# Patient Record
Sex: Female | Born: 1965 | Race: White | Hispanic: No | Marital: Married | Smoking: Never smoker
Health system: Southern US, Community
[De-identification: ages and names within clinical notes are randomized; demographics above are authoritative.]

## PROBLEM LIST (undated history)

## (undated) DIAGNOSIS — F32A Depression, unspecified: Secondary | ICD-10-CM

## (undated) DIAGNOSIS — R42 Dizziness and giddiness: Secondary | ICD-10-CM

## (undated) DIAGNOSIS — G43909 Migraine, unspecified, not intractable, without status migrainosus: Secondary | ICD-10-CM

## (undated) DIAGNOSIS — T7840XA Allergy, unspecified, initial encounter: Secondary | ICD-10-CM

## (undated) DIAGNOSIS — I454 Nonspecific intraventricular block: Secondary | ICD-10-CM

## (undated) DIAGNOSIS — F410 Panic disorder [episodic paroxysmal anxiety] without agoraphobia: Secondary | ICD-10-CM

## (undated) DIAGNOSIS — F329 Major depressive disorder, single episode, unspecified: Secondary | ICD-10-CM

## (undated) HISTORY — DX: Allergy, unspecified, initial encounter: T78.40XA

## (undated) HISTORY — DX: Migraine, unspecified, not intractable, without status migrainosus: G43.909

## (undated) HISTORY — PX: WISDOM TOOTH EXTRACTION: SHX21

---

## 1999-05-04 ENCOUNTER — Other Ambulatory Visit: Admission: RE | Admit: 1999-05-04 | Discharge: 1999-05-04 | Payer: Self-pay | Admitting: Gynecology

## 1999-10-23 ENCOUNTER — Encounter: Payer: Self-pay | Admitting: Emergency Medicine

## 1999-10-23 ENCOUNTER — Emergency Department (HOSPITAL_COMMUNITY): Admission: EM | Admit: 1999-10-23 | Discharge: 1999-10-23 | Payer: Self-pay | Admitting: Emergency Medicine

## 1999-12-02 ENCOUNTER — Inpatient Hospital Stay (HOSPITAL_COMMUNITY): Admission: AD | Admit: 1999-12-02 | Discharge: 1999-12-04 | Payer: Self-pay | Admitting: Gynecology

## 1999-12-02 ENCOUNTER — Encounter (INDEPENDENT_AMBULATORY_CARE_PROVIDER_SITE_OTHER): Payer: Self-pay | Admitting: Specialist

## 2000-01-22 ENCOUNTER — Other Ambulatory Visit: Admission: RE | Admit: 2000-01-22 | Discharge: 2000-01-22 | Payer: Self-pay | Admitting: Internal Medicine

## 2000-06-05 ENCOUNTER — Encounter: Payer: Self-pay | Admitting: Gynecology

## 2000-06-05 ENCOUNTER — Ambulatory Visit (HOSPITAL_COMMUNITY): Admission: RE | Admit: 2000-06-05 | Discharge: 2000-06-05 | Payer: Self-pay | Admitting: Gynecology

## 2002-10-06 ENCOUNTER — Other Ambulatory Visit: Admission: RE | Admit: 2002-10-06 | Discharge: 2002-10-06 | Payer: Self-pay | Admitting: Gynecology

## 2008-06-09 ENCOUNTER — Other Ambulatory Visit: Admission: RE | Admit: 2008-06-09 | Discharge: 2008-06-09 | Payer: Self-pay | Admitting: Gynecology

## 2009-07-08 ENCOUNTER — Other Ambulatory Visit: Admission: RE | Admit: 2009-07-08 | Discharge: 2009-07-08 | Payer: Self-pay | Admitting: Family Medicine

## 2011-04-06 NOTE — Discharge Summary (Signed)
[facility]  Patient:    Stephanie Chan                   MRN: [id number] Adm. Date:  [id number] Disc. Date: [id number] Attending:  Merrily Pew Dictator:   Gwendolyn Fill. [place], P.A.                           Discharge Summary  DISCHARGE DIAGNOSES:          1. Intrauterine pregnancy 38+ weeks, delivered,                                  status post spontaneous vaginal delivery                               2. History of prior cesarean section.                               3. Successful vaginal delivery without cesarean                                  section.  HISTORY OF PRESENT ILLNESS:   This is a 45 year old female, gravida 3, para 2, ith and EDC of December 15, 1999.  Prenatal course has been complicated by history of positive group B strep for previous pregnancy.  Also, had history of prior cesarean section, desired trial of labor, and was on Prozac 40 mg q.d. for depression/obsessive-compulsive disorder.  HOSPITAL COURSE:              On December 02, 1999, the patient presented at 38 weeks in labor and urge to push at complete _______, and +1.  There was bulging  membranes.  At [facility] of membranes was noted to be thin meconium, and subsequently the patient underwent a spontaneous vaginal delivery on December 02, 1999, at 0310 of a female, Apgars 8 and 9, weight of 7 pounds and 12 ounces. There was no episiotomy.  There was as second degree midline laceration.  There were o complications.  Lower uterine segment was digitally palpated and was intact. Postpartum the patient remained afebrile, voiding well, and in stable condition, and was discharged home on December 04, 1999, and given [facility] Gynecology postpartum instruction sheet and postpartum booklet.  ACCESSORY CLINICAL FINDINGS:  Labs:  The patient is O positive, rubella immune. On December 03, 1999, hemoglobin was 10.4, hematocrit 30.0.  DISPOSITION:                   The patient is discharged to home.  FOLLOWUP:                     To return in six weeks.  If any problems prior to  that time is to be seen in the office. DD:  12/21/99 TD:  12/23/99 Job: [id number]

## 2012-11-25 ENCOUNTER — Encounter (HOSPITAL_COMMUNITY): Payer: Self-pay

## 2012-11-25 ENCOUNTER — Emergency Department (HOSPITAL_COMMUNITY)
Admission: EM | Admit: 2012-11-25 | Discharge: 2012-11-25 | Disposition: A | Discharge status: Home / Self Care | Payer: No Typology Code available for payment source | Attending: Emergency Medicine | Admitting: Emergency Medicine

## 2012-11-25 DIAGNOSIS — R110 Nausea: Secondary | ICD-10-CM | POA: Insufficient documentation

## 2012-11-25 DIAGNOSIS — Z8679 Personal history of other diseases of the circulatory system: Secondary | ICD-10-CM | POA: Insufficient documentation

## 2012-11-25 DIAGNOSIS — Z79899 Other long term (current) drug therapy: Secondary | ICD-10-CM | POA: Insufficient documentation

## 2012-11-25 DIAGNOSIS — F329 Major depressive disorder, single episode, unspecified: Secondary | ICD-10-CM | POA: Insufficient documentation

## 2012-11-25 DIAGNOSIS — Z7982 Long term (current) use of aspirin: Secondary | ICD-10-CM | POA: Insufficient documentation

## 2012-11-25 DIAGNOSIS — R42 Dizziness and giddiness: Secondary | ICD-10-CM | POA: Insufficient documentation

## 2012-11-25 DIAGNOSIS — F410 Panic disorder [episodic paroxysmal anxiety] without agoraphobia: Secondary | ICD-10-CM | POA: Insufficient documentation

## 2012-11-25 DIAGNOSIS — F17200 Nicotine dependence, unspecified, uncomplicated: Secondary | ICD-10-CM | POA: Insufficient documentation

## 2012-11-25 DIAGNOSIS — F3289 Other specified depressive episodes: Secondary | ICD-10-CM | POA: Insufficient documentation

## 2012-11-25 HISTORY — DX: Nonspecific intraventricular block: I45.4

## 2012-11-25 HISTORY — DX: Panic disorder (episodic paroxysmal anxiety): F41.0

## 2012-11-25 HISTORY — DX: Depression, unspecified: F32.A

## 2012-11-25 HISTORY — DX: Dizziness and giddiness: R42

## 2012-11-25 HISTORY — DX: Major depressive disorder, single episode, unspecified: F32.9

## 2012-11-25 LAB — POCT I-STAT, CHEM 8
Calcium, Ion: 1.14 mmol/L (ref 1.12–1.23)
Creatinine, Ser: 0.7 mg/dL (ref 0.50–1.10)
Glucose, Bld: 86 mg/dL (ref 70–99)
HCT: 38 % (ref 36.0–46.0)
Hemoglobin: 12.9 g/dL (ref 12.0–15.0)
TCO2: 24 mmol/L (ref 0–100)

## 2012-11-25 LAB — URINALYSIS, ROUTINE W REFLEX MICROSCOPIC
Bilirubin Urine: NEGATIVE
Hgb urine dipstick: NEGATIVE
Ketones, ur: NEGATIVE mg/dL
Nitrite: NEGATIVE
Urobilinogen, UA: 0.2 mg/dL (ref 0.0–1.0)

## 2012-11-25 MED ORDER — LORAZEPAM 1 MG PO TABS: 1.0000 mg | ORAL_TABLET | Freq: Four times a day (QID) | ORAL | Status: DC | PRN

## 2012-11-25 MED ORDER — SODIUM CHLORIDE 0.9 % IV SOLN
INTRAVENOUS | Status: DC
  Administered 2012-11-25: 11:00:00 via INTRAVENOUS

## 2012-11-25 MED ORDER — ONDANSETRON HCL 4 MG/2ML IJ SOLN
4.0000 mg | Freq: Once | INTRAMUSCULAR | Status: AC
  Administered 2012-11-25: 4 mg via INTRAVENOUS
  Filled 2012-11-25: qty 2

## 2012-11-25 MED ORDER — LORAZEPAM 2 MG/ML IJ SOLN
1.0000 mg | Freq: Once | INTRAMUSCULAR | Status: AC
  Administered 2012-11-25: 1 mg via INTRAVENOUS
  Filled 2012-11-25: qty 1

## 2012-11-25 NOTE — ED Notes (Signed)
Patient reports that she developed dizziness and nausea this AM around 0830 today. Patient states she has been off of her Lexapro x 1 week. Patient reports vertigo history.

## 2012-11-25 NOTE — Progress Notes (Signed)
Pt confirms pcp as Mila Palmer at [facility] medicine EPIC updated

## 2012-11-25 NOTE — ED Provider Notes (Signed)
History     CSN: [id number]  Arrival date & time 11/25/12  1006   First MD Initiated Contact with Patient 11/25/12 1015      Chief Complaint  Patient presents with  . Dizziness    (Consider location/radiation/quality/duration/timing/severity/associated sxs/prior treatment) HPI Comments: Stephanie Chan is a 47 y.o. female who has had vertiginous dizziness that started when she awoke, her from sleep this morning. The dizziness is aggravated by opening her eyes and makes walking difficult. She attributes the dizziness to being off her Lexapro for one week. She accidentally ran out of the prescription. She has had this previously, but not chronically. No recent fever, or chills, or vomiting. She's been nauseated this morning. She has mild, nasal congestion, but no facial pain. She has been able to eat up until this morning. She denies recent stress, and there has been no head trauma. She is not on any chronic medications. There are no other modifying factors.  The history is provided by the patient.    Past Medical History  Diagnosis Date  . Vertigo   . Bundle branch block   . Depression   . Panic attack     Past Surgical History  Procedure Date  . Cesarean section     No family history on file.  History  Substance Use Topics  . Smoking status: Current Some Day Smoker  . Smokeless tobacco: Never Used  . Alcohol Use: Yes     Comment: socially    OB History    Grav Para Term Preterm Abortions TAB SAB Ect Mult Living                  Review of Systems  All other systems reviewed and are negative.    Allergies  Review of patient's allergies indicates no known allergies.  Home Medications   Current Outpatient Rx  Name  Route  Sig  Dispense  Refill  . ACETAMINOPHEN 500 MG PO TABS   Oral   Take 500 mg by mouth every 6 (six) hours as needed. Pain         . ALPRAZOLAM 0.25 MG PO TABS   Oral   Take 0.25 mg by mouth as needed. anxiety         . ASPIRIN EC  81 MG PO TBEC   Oral   Take 81 mg by mouth daily.         . BUPROPION HCL ER (XL) 300 MG PO TB24   Oral   Take 300 mg by mouth daily.         Marland Kitchen ESCITALOPRAM OXALATE 10 MG PO TABS   Oral   Take 10 mg by mouth daily.         . IBUPROFEN 200 MG PO TABS   Oral   Take 400 mg by mouth every 6 (six) hours as needed. Pain         . ADULT MULTIVITAMIN W/MINERALS CH   Oral   Take 1 tablet by mouth daily.         Marland Kitchen LORAZEPAM 1 MG PO TABS   Oral   Take 1 tablet (1 mg total) by mouth every 6 (six) hours as needed (dizziness).   15 tablet   0     BP 103/57  Pulse 70  Temp 98.6 F (37 C)  Resp 19  SpO2 100%  LMP 10/25/2012  Physical Exam  Nursing note and vitals reviewed. Constitutional: She is oriented to person, place, and  time. She appears well-developed and well-nourished.  HENT:  Head: Normocephalic and atraumatic.  Eyes: Conjunctivae normal and EOM are normal. Pupils are equal, round, and reactive to light.  Neck: Normal range of motion and phonation normal. Neck supple.  Cardiovascular: Normal rate, regular rhythm and intact distal pulses.   Pulmonary/Chest: Effort normal and breath sounds normal. She exhibits no tenderness.  Abdominal: Soft. She exhibits no distension. There is no tenderness. There is no guarding.  Musculoskeletal: Normal range of motion.  Neurological: She is alert and oriented to person, place, and time. She has normal strength. No cranial nerve deficit. She exhibits normal muscle tone. Coordination normal.       No nystagmus  Skin: Skin is warm and dry.  Psychiatric: She has a normal mood and affect. Her behavior is normal. Judgment and thought content normal.    ED Course  Procedures (including critical care time)   ED Treatment: IVF, Ativan, Zofran    Date: 09/05/2012  Rate: 76  Rhythm: normal sinus rhythm  QRS Axis: normal  PR and QT Intervals: normal  ST/T Wave abnormalities: normal  PR and QRS Conduction Disutrbances:left  bundle branch block  Narrative Interpretation: Pt recalls prior hx of LBBB  Old EKG Reviewed: none available   Reeval.: 14:35- she is ambulating easily without ataxia or dizziness. She's just voided again, for urine sample.   Labs Reviewed  POCT I-STAT, CHEM 8  URINALYSIS, ROUTINE W REFLEX MICROSCOPIC   Nursing notes, applicable records and vitals reviewed.  Radiologic Images/Reports reviewed.    1. Dizziness       MDM  Nonspecific dizziness, possible labyrinthitis. Doubt CVA, ACS, metabolic instability, or of infection. She is stable for discharge.      Plan: Home Medications- Ativan; Home Treatments- rest, fluids; Recommended follow up- PCP, 3-5 days    Flint Melter, MD 11/25/12 [phone]

## 2012-11-25 NOTE — ED Notes (Signed)
MD at bedside. 

## 2012-11-25 NOTE — ED Notes (Signed)
Patient ambulated 40 feet with little assistance. Patient 's heart rate stayed at 85 bets per minute.

## 2012-11-25 NOTE — ED Notes (Signed)
Pt sts dizziness and nausea starting this AM.  Denies pain.  Sts "I think, I did this to myself.  I think, it's withdrawal from my Lexapro."  Reports Hx of vertigo.

## 2012-12-26 ENCOUNTER — Other Ambulatory Visit (HOSPITAL_COMMUNITY)
Admission: RE | Admit: 2012-12-26 | Discharge: 2012-12-26 | Disposition: A | Discharge status: Home / Self Care | Payer: No Typology Code available for payment source | Source: Ambulatory Visit | Attending: Family Medicine | Admitting: Family Medicine

## 2012-12-26 ENCOUNTER — Other Ambulatory Visit: Payer: Self-pay | Admitting: Family Medicine

## 2012-12-26 DIAGNOSIS — Z124 Encounter for screening for malignant neoplasm of cervix: Secondary | ICD-10-CM | POA: Insufficient documentation

## 2012-12-26 DIAGNOSIS — Z1151 Encounter for screening for human papillomavirus (HPV): Secondary | ICD-10-CM | POA: Insufficient documentation

## 2013-04-02 ENCOUNTER — Encounter (HOSPITAL_COMMUNITY): Payer: Self-pay | Admitting: *Deleted

## 2013-04-02 ENCOUNTER — Emergency Department (HOSPITAL_COMMUNITY)
Admission: EM | Admit: 2013-04-02 | Discharge: 2013-04-02 | Disposition: A | Discharge status: Home / Self Care | Payer: No Typology Code available for payment source | Attending: Emergency Medicine | Admitting: Emergency Medicine

## 2013-04-02 DIAGNOSIS — Z79899 Other long term (current) drug therapy: Secondary | ICD-10-CM | POA: Insufficient documentation

## 2013-04-02 DIAGNOSIS — W268XXA Contact with other sharp object(s), not elsewhere classified, initial encounter: Secondary | ICD-10-CM | POA: Insufficient documentation

## 2013-04-02 DIAGNOSIS — Z87898 Personal history of other specified conditions: Secondary | ICD-10-CM

## 2013-04-02 DIAGNOSIS — Z8679 Personal history of other diseases of the circulatory system: Secondary | ICD-10-CM | POA: Insufficient documentation

## 2013-04-02 DIAGNOSIS — Y9289 Other specified places as the place of occurrence of the external cause: Secondary | ICD-10-CM | POA: Insufficient documentation

## 2013-04-02 DIAGNOSIS — S61509A Unspecified open wound of unspecified wrist, initial encounter: Secondary | ICD-10-CM | POA: Insufficient documentation

## 2013-04-02 DIAGNOSIS — IMO0002 Reserved for concepts with insufficient information to code with codable children: Secondary | ICD-10-CM

## 2013-04-02 DIAGNOSIS — Z8659 Personal history of other mental and behavioral disorders: Secondary | ICD-10-CM

## 2013-04-02 DIAGNOSIS — Z87891 Personal history of nicotine dependence: Secondary | ICD-10-CM | POA: Insufficient documentation

## 2013-04-02 DIAGNOSIS — F3289 Other specified depressive episodes: Secondary | ICD-10-CM | POA: Insufficient documentation

## 2013-04-02 DIAGNOSIS — Y93E9 Activity, other interior property and clothing maintenance: Secondary | ICD-10-CM | POA: Insufficient documentation

## 2013-04-02 DIAGNOSIS — F329 Major depressive disorder, single episode, unspecified: Secondary | ICD-10-CM | POA: Insufficient documentation

## 2013-04-02 DIAGNOSIS — Z8669 Personal history of other diseases of the nervous system and sense organs: Secondary | ICD-10-CM | POA: Insufficient documentation

## 2013-04-02 DIAGNOSIS — F410 Panic disorder [episodic paroxysmal anxiety] without agoraphobia: Secondary | ICD-10-CM | POA: Insufficient documentation

## 2013-04-02 DIAGNOSIS — Z7982 Long term (current) use of aspirin: Secondary | ICD-10-CM | POA: Insufficient documentation

## 2013-04-02 NOTE — ED Provider Notes (Signed)
Medical screening examination/treatment/procedure(s) were conducted as a shared visit with non-physician practitioner(s) and myself.  I personally evaluated the patient during the encounter.  This 47 year old female has a small linear laceration left volar wrist accident trauma wound explored through range of motion with good hemostasis no foreign body noted no significant deep structure involvement noted such as any tendon involvement.  Hurman Horn, MD 04/13/13 [phone]

## 2013-04-02 NOTE — ED Provider Notes (Signed)
History     CSN: [id number]  Arrival date & time 04/02/13  1308   First MD Initiated Contact with Patient 04/02/13 0820      Chief Complaint  Patient presents with  . Extremity Laceration    (Consider location/radiation/quality/duration/timing/severity/associated sxs/prior treatment) HPI Comments: Stephanie Chan is a 47 y/o F with PMhx of vertigo, BBB, depression, and panic attacks presenting to the ED after sustaining an injury to th left wrist. Patient reported that as she was cleaning the vase in the sink this morning the glass vase shattered, leading to a horizontal laceration to the flexor surface of the left wrist. Patient reported that there is a burning sensation to the site with mild discomfort with motion, mainly with extension to the left wrist. Patient reported that she came immediately to the hospital, applying compression to the site. Denied numbness, tingling, fainting, light-headedness, dizziness, syncope, chest pain, shortness of breathe, difficulty breathing, visual distortions.  PCP: Dr. Ali Lowe  Tdap administered recently  The history is provided by the patient. No language interpreter was used.    Past Medical History  Diagnosis Date  . Vertigo   . Bundle branch block   . Depression   . Panic attack     Past Surgical History  Procedure Laterality Date  . Cesarean section      No family history on file.  History  Substance Use Topics  . Smoking status: Former Games developer  . Smokeless tobacco: Never Used  . Alcohol Use: Yes     Comment: socially    OB History   Grav Para Term Preterm Abortions TAB SAB Ect Mult Living                  Review of Systems  Constitutional: Negative for fever, chills and fatigue.  HENT: Negative for ear pain, sore throat, trouble swallowing, neck pain and neck stiffness.   Eyes: Negative for photophobia, pain and visual disturbance.  Respiratory: Negative for cough, chest tightness and shortness of breath.     Cardiovascular: Negative for chest pain.  Gastrointestinal: Negative for nausea, vomiting, abdominal pain and diarrhea.  Genitourinary: Negative for dysuria, hematuria and difficulty urinating.  Musculoskeletal: Negative for back pain and arthralgias.  Skin: Positive for wound (laceration to left wrist).  Neurological: Negative for dizziness, weakness, light-headedness, numbness and headaches.  All other systems reviewed and are negative.    Allergies  Review of patient's allergies indicates no known allergies.  Home Medications   Current Outpatient Rx  Name  Route  Sig  Dispense  Refill  . acetaminophen (TYLENOL) 500 MG tablet   Oral   Take 500 mg by mouth every 6 (six) hours as needed. Pain         . ALPRAZolam (XANAX) 0.25 MG tablet   Oral   Take 0.25 mg by mouth daily as needed. anxiety         . aspirin EC 81 MG tablet   Oral   Take 81 mg by mouth daily.         Marland Kitchen buPROPion (WELLBUTRIN XL) 300 MG 24 hr tablet   Oral   Take 300 mg by mouth daily.         . cetirizine-pseudoephedrine (ZYRTEC-D) 5-120 MG per tablet   Oral   Take 1 tablet by mouth 2 (two) times daily.         Marland Kitchen escitalopram (LEXAPRO) 10 MG tablet   Oral   Take 10 mg by mouth daily.         Marland Kitchen  ibuprofen (ADVIL,MOTRIN) 200 MG tablet   Oral   Take 400 mg by mouth every 6 (six) hours as needed. Pain         . Multiple Vitamin (MULTIVITAMIN WITH MINERALS) TABS   Oral   Take 1 tablet by mouth daily.           BP 128/81  Pulse 97  Temp(Src) 97.9 F (36.6 C) (Oral)  Resp 16  SpO2 100%  LMP 10/26/2012  Physical Exam  Nursing note and vitals reviewed. Constitutional: She is oriented to person, place, and time. She appears well-developed and well-nourished. No distress.  HENT:  Head: Normocephalic and atraumatic.  Mouth/Throat: Oropharynx is clear and moist. No oropharyngeal exudate.  Uvula midline, symmetrical elevation  Eyes: Conjunctivae and EOM are normal. Pupils are equal,  round, and reactive to light. Right eye exhibits no discharge. Left eye exhibits no discharge.  Neck: Normal range of motion. Neck supple. No tracheal deviation present.  Negative nuchal rigidity Negative neck stiffness Negative lymphadenopathy  Cardiovascular: Normal rate, regular rhythm and normal heart sounds.   Radial pulses 2+ bilaterally Pedal pulses 2+ bilaterally Negative leg and ankle swelling Negative pitting edema  Pulmonary/Chest: Effort normal and breath sounds normal. No respiratory distress. She has no wheezes. She has no rales. She exhibits no tenderness.  Musculoskeletal: Normal range of motion. She exhibits no edema and no tenderness.       Arms:      Left hand: She exhibits normal range of motion, no tenderness, normal capillary refill and no deformity. Normal sensation noted. Normal strength noted.       Hands: Full ROM to left wrist Strength 5+/5+ to upper extremities bilaterally  No foreign bodies noted Negative tendon and deep tendon involvement noted  Lymphadenopathy:    She has no cervical adenopathy.  Neurological: She is alert and oriented to person, place, and time. No cranial nerve deficit. She exhibits normal muscle tone. Coordination normal. GCS eye subscore is 4. GCS verbal subscore is 5. GCS motor subscore is 6.  Cranial nerves III-XII grossly intact  Sensation to upper extremities present, bilaterally, with differentiation to sharp and dull sensation Two point discrimination present to radial aspect of left index finger and ulnar aspect of left small finger  Skin: Skin is warm and dry. No rash noted. She is not diaphoretic. No erythema.  Psychiatric: She has a normal mood and affect. Her behavior is normal. Thought content normal.    ED Course  Procedures (including critical care time) LACERATION REPAIR Performed by: Raymon Mutton Authorized by: Raymon Mutton Consent: Verbal consent obtained. Risks and benefits: risks, benefits and  alternatives were discussed Consent given by: patient Patient identity confirmed: provided demographic data Prepped and Draped in normal sterile fashion Wound explored  Laceration Location: flexor surface of left wrist  Laceration Length: 3 cm  No Foreign Bodies seen or palpated  Anesthesia: local infiltration  Local anesthetic: lidocaine 2% with epinephrine  Anesthetic total: 3.5 ml  Irrigation method: syringe Amount of cleaning: standard  Skin closure: approximate  Number of sutures: 7  Technique: single interrupted 5-0 Ethilon used  Patient tolerance: Patient tolerated the procedure well with no immediate complications.   Labs Reviewed - No data to display No results found.   1. Laceration   2. History of vertigo   3. History of depression       MDM  Wound explored with good hemostasis through ROM. No foreign bodies noted. Negative tendon or deep tendon involvement noted. No  neurovascular damaged noted. Laceration closed. Tetanus recently administered, no need to give booster. Discharged patient. Discussed wound care and removal of sutures. Referred patient to follow-up with PCP and Urgent care Center. Discussed with patient to rest and avoid active use of left wrist. No antibiotics recommended. Discussed with patient to monitor symptoms and if symptoms are to worsen or change to report back to the ED. Patient agreed to plan of care, understood, all questions answered.         Raymon Mutton, PA-C 04/02/13 1704

## 2013-04-02 NOTE — ED Notes (Addendum)
Pt states she cut her wrist on a vase. App 1 1/2" horizontal laceration noted to anterior left wrist.

## 2013-04-09 ENCOUNTER — Encounter (HOSPITAL_COMMUNITY): Payer: Self-pay | Admitting: Emergency Medicine

## 2013-04-09 ENCOUNTER — Emergency Department (INDEPENDENT_AMBULATORY_CARE_PROVIDER_SITE_OTHER)
Admission: EM | Admit: 2013-04-09 | Discharge: 2013-04-09 | Disposition: A | Discharge status: Home / Self Care | Payer: No Typology Code available for payment source | Source: Home / Self Care | Attending: Emergency Medicine | Admitting: Emergency Medicine

## 2013-04-09 DIAGNOSIS — Z4802 Encounter for removal of sutures: Secondary | ICD-10-CM

## 2013-04-09 NOTE — ED Notes (Signed)
Discussed wound care with pt.  Instructed to come back if it gets worse in any way.

## 2013-04-09 NOTE — ED Provider Notes (Signed)
History     CSN: [id number]  Arrival date & time 04/09/13  1525   First MD Initiated Contact with Patient 04/09/13 1533      Chief Complaint  Patient presents with  . Suture / Staple Removal    (Consider location/radiation/quality/duration/timing/severity/associated sxs/prior treatment) HPI Comments: Patient presents urgent care to have sutures removed from the palmar surface of her left wrist. She's able to move all her fingers. No numbness or tingling sensation distally or proximally to the area of laceration. No signs of secondary infection. No active drainage increased warmth. She has been applying topical antibiotics program.  Patient is a 47 y.o. female presenting with suture removal. The history is provided by the patient.  Suture / Staple Removal This is a new problem. The problem occurs constantly. The problem has been gradually worsening. Pertinent negatives include no abdominal pain and no headaches. The symptoms are aggravated by bending. Nothing relieves the symptoms. The treatment provided no relief.    Past Medical History  Diagnosis Date  . Vertigo   . Bundle branch block   . Depression   . Panic attack     Past Surgical History  Procedure Laterality Date  . Cesarean section      History reviewed. No pertinent family history.  History  Substance Use Topics  . Smoking status: Former Games developer  . Smokeless tobacco: Never Used  . Alcohol Use: Yes     Comment: socially    OB History   Grav Para Term Preterm Abortions TAB SAB Ect Mult Living                  Review of Systems  Constitutional: Negative for fever, activity change, appetite change and fatigue.  Gastrointestinal: Negative for abdominal pain.  Musculoskeletal: Negative for joint swelling and arthralgias.  Skin: Positive for wound. Negative for color change, pallor and rash.  Neurological: Negative for weakness, numbness and headaches.    Allergies  Review of patient's allergies indicates  no known allergies.  Home Medications   Current Outpatient Rx  Name  Route  Sig  Dispense  Refill  . acetaminophen (TYLENOL) 500 MG tablet   Oral   Take 500 mg by mouth every 6 (six) hours as needed. Pain         . ALPRAZolam (XANAX) 0.25 MG tablet   Oral   Take 0.25 mg by mouth daily as needed. anxiety         . aspirin EC 81 MG tablet   Oral   Take 81 mg by mouth daily.         Marland Kitchen buPROPion (WELLBUTRIN XL) 300 MG 24 hr tablet   Oral   Take 300 mg by mouth daily.         . cetirizine-pseudoephedrine (ZYRTEC-D) 5-120 MG per tablet   Oral   Take 1 tablet by mouth 2 (two) times daily.         Marland Kitchen escitalopram (LEXAPRO) 10 MG tablet   Oral   Take 10 mg by mouth daily.         Marland Kitchen ibuprofen (ADVIL,MOTRIN) 200 MG tablet   Oral   Take 400 mg by mouth every 6 (six) hours as needed. Pain         . Multiple Vitamin (MULTIVITAMIN WITH MINERALS) TABS   Oral   Take 1 tablet by mouth daily.           BP 119/70  Pulse 85  Temp(Src) 99.3 F (37.4 C) (Oral)  Resp 16  SpO2 98%  LMP 10/26/2012  Physical Exam  Constitutional: She appears well-developed and well-nourished.  Neurological: She is alert. She has normal strength. No sensory deficit. She exhibits normal muscle tone.  Skin: No rash noted. There is erythema.       ED Course  Procedures (including critical care time)  Labs Reviewed - No data to display No results found.   1. Encounter for removal of sutures       MDM   Uncomplicated suture removal of the lower aspect of left wrist. We have applied Steri-Strips for extra support for the next 5 days. No signs of secondary infection. Patient doing well        Jimmie Molly, MD 04/09/13 [phone]

## 2013-04-09 NOTE — ED Notes (Signed)
Pt presents for removal of 6 stitches on left wrist. Was cleaning and broke a vase and cut her wrist.  Had 7 stitches but one fell out. Site is red and still tender but no other problems. Patient is alert and oriented.

## 2016-03-09 ENCOUNTER — Emergency Department (HOSPITAL_COMMUNITY)
Admission: EM | Admit: 2016-03-09 | Discharge: 2016-03-09 | Disposition: A | Discharge status: Home / Self Care | Payer: BLUE CROSS/BLUE SHIELD | Attending: Emergency Medicine | Admitting: Emergency Medicine

## 2016-03-09 ENCOUNTER — Encounter (HOSPITAL_COMMUNITY): Payer: Self-pay

## 2016-03-09 DIAGNOSIS — R438 Other disturbances of smell and taste: Secondary | ICD-10-CM | POA: Diagnosis present

## 2016-03-09 DIAGNOSIS — G510 Bell's palsy: Secondary | ICD-10-CM | POA: Insufficient documentation

## 2016-03-09 DIAGNOSIS — Z791 Long term (current) use of non-steroidal anti-inflammatories (NSAID): Secondary | ICD-10-CM | POA: Insufficient documentation

## 2016-03-09 DIAGNOSIS — I454 Nonspecific intraventricular block: Secondary | ICD-10-CM | POA: Diagnosis not present

## 2016-03-09 DIAGNOSIS — F329 Major depressive disorder, single episode, unspecified: Secondary | ICD-10-CM | POA: Insufficient documentation

## 2016-03-09 DIAGNOSIS — Z7952 Long term (current) use of systemic steroids: Secondary | ICD-10-CM | POA: Insufficient documentation

## 2016-03-09 DIAGNOSIS — Z7982 Long term (current) use of aspirin: Secondary | ICD-10-CM | POA: Insufficient documentation

## 2016-03-09 MED ORDER — ARTIFICIAL TEARS OP OINT: TOPICAL_OINTMENT | OPHTHALMIC | Status: DC | PRN

## 2016-03-09 MED ORDER — PREDNISONE 10 MG PO TABS: 60.0000 mg | ORAL_TABLET | Freq: Every day | ORAL | Status: DC

## 2016-03-09 MED ORDER — VALACYCLOVIR HCL 1 G PO TABS: 1000.0000 mg | ORAL_TABLET | Freq: Three times a day (TID) | ORAL | Status: AC

## 2016-03-09 NOTE — ED Provider Notes (Signed)
CSN: [id number]     Arrival date & time 03/09/16  1340 History   First MD Initiated Contact with Patient 03/09/16 1505     Chief Complaint  Patient presents with  . Metal Taste in Mouth   . Eye Problem     (Consider location/radiation/quality/duration/timing/severity/associated sxs/prior Treatment) HPI Comments: Patient here complaining on right tongue metallic taste 2 days. She also notes that she has trouble blinking her right eye and that she has had excessive tearing. Denies any speech trouble. Weakness to her arms or legs. Has noticed drooping on the right side of her mouth. Denies any recent ear pain or URI symptoms. No headache or vomiting. No visual loss. Symptoms have been progressively worse. No prior history of same. No treatment used prior to arrival  Patient is a 50 y.o. female presenting with eye problem. The history is provided by the patient and the spouse.  Eye Problem   Past Medical History  Diagnosis Date  . Vertigo   . Bundle branch block   . Depression   . Panic attack    Past Surgical History  Procedure Laterality Date  . Cesarean section     History reviewed. No pertinent family history. Social History  Substance Use Topics  . Smoking status: Never Smoker   . Smokeless tobacco: Never Used  . Alcohol Use: Yes     Comment: socially   OB History    No data available     Review of Systems  All other systems reviewed and are negative.     Allergies  Review of patient's allergies indicates no known allergies.  Home Medications   Prior to Admission medications   Medication Sig Start Date End Date Taking? Authorizing Provider  acetaminophen (TYLENOL) 500 MG tablet Take 1,000 mg by mouth every 6 (six) hours as needed for moderate pain or headache. Pain   Yes Historical Provider, MD  ALPRAZolam (XANAX) 0.25 MG tablet Take 0.25 mg by mouth daily as needed. anxiety   Yes Historical Provider, MD  aspirin EC 81 MG tablet Take 81 mg by mouth daily.    Yes Historical Provider, MD  buPROPion (WELLBUTRIN XL) 300 MG 24 hr tablet Take 300 mg by mouth daily.   Yes Historical Provider, MD  cetirizine-pseudoephedrine (ZYRTEC-D) 5-120 MG per tablet Take 1 tablet by mouth daily as needed for allergies.    Yes Historical Provider, MD  escitalopram (LEXAPRO) 10 MG tablet Take 10 mg by mouth daily.   Yes Historical Provider, MD  fexofenadine-pseudoephedrine (ALLEGRA-D) 60-120 MG 12 hr tablet Take 1 tablet by mouth 2 (two) times daily as needed (allergies).   Yes Historical Provider, MD  ibuprofen (ADVIL,MOTRIN) 200 MG tablet Take 400 mg by mouth every 6 (six) hours as needed. Pain   Yes Historical Provider, MD  Multiple Vitamin (MULTIVITAMIN WITH MINERALS) TABS Take 1 tablet by mouth daily.   Yes Historical Provider, MD  artificial tears (LACRILUBE) OINT ophthalmic ointment Place into the right eye every 2 (two) hours as needed for dry eyes. 03/09/16   Lorre NickAnthony Rockey Guarino, MD  predniSONE (DELTASONE) 10 MG tablet Take 6 tablets (60 mg total) by mouth daily. Take 6 tablets a day 5 days then 1 tablet per day 5 days 03/09/16   Lorre NickAnthony Jaqualyn Juday, MD  valACYclovir (VALTREX) 1000 MG tablet Take 1 tablet (1,000 mg total) by mouth 3 (three) times daily. 03/09/16 03/23/16  Lorre NickAnthony Fareeha Evon, MD   BP 124/76 mmHg  Pulse 80  Temp(Src) 98 F (36.7 C) (Oral)  Resp 18  SpO2 98% Physical Exam  Constitutional: She is oriented to person, place, and time. She appears well-developed and well-nourished.  Non-toxic appearance. No distress.  HENT:  Head: Normocephalic and atraumatic.  Ears:  Eyes: Conjunctivae, EOM and lids are normal. Pupils are equal, round, and reactive to light.  Neck: Normal range of motion. Neck supple. No tracheal deviation present. No thyroid mass present.  Cardiovascular: Normal rate, regular rhythm and normal heart sounds.  Exam reveals no gallop.   No murmur heard. Pulmonary/Chest: Effort normal and breath sounds normal. No stridor. No respiratory distress. She  has no decreased breath sounds. She has no wheezes. She has no rhonchi. She has no rales.  Abdominal: Soft. Normal appearance and bowel sounds are normal. She exhibits no distension. There is no tenderness. There is no rebound and no CVA tenderness.  Musculoskeletal: Normal range of motion. She exhibits no edema or tenderness.  Neurological: She is alert and oriented to person, place, and time. She has normal strength. She displays no atrophy. A cranial nerve deficit and sensory deficit is present. Coordination and gait normal. GCS eye subscore is 4. GCS verbal subscore is 5. GCS motor subscore is 6.  Right-sided facial droop involving the forehead muscles as well. No rash noted on the patient's face. Extraocular muscles are intact but decreased ability to close the right eye noted  Skin: Skin is warm and dry. No abrasion and no rash noted.  Psychiatric: She has a normal mood and affect. Her speech is normal and behavior is normal.  Nursing note and vitals reviewed.   ED Course  Procedures (including critical care time) Labs Review Labs Reviewed - No data to display  Imaging Review No results found. I have personally reviewed and evaluated these images and lab results as part of my medical decision-making.   EKG Interpretation None      MDM   Final diagnoses:  Bell's palsy    Patient's clinical symptoms consistent with Bell's palsy. Will treat with prednisone, Valtrex, watering drops for her eye and given neurological referral    Lorre Nick, MD 03/09/16 1549

## 2016-03-09 NOTE — Discharge Instructions (Signed)
Bell Palsy °Bell palsy is a condition in which the muscles on one side of the face become paralyzed. This often causes one side of the face to droop. It is a common condition and most people recover completely. °RISK FACTORS °Risk factors for Bell palsy include: °· Pregnancy. °· Diabetes. °· An infection by a virus, such as infections that cause cold sores. °CAUSES  °Bell palsy is caused by damage to or inflammation of a nerve in your face. It is unclear why this happens, but an infection by a virus may lead to it. Most of the time the reason it happens is unknown. °SIGNS AND SYMPTOMS  °Symptoms can range from mild to severe and can take place over a number of hours. Symptoms may include: °· Being unable to: °¨ Raise one or both eyebrows. °¨ Close one or both eyes. °¨ Feel parts of your face (facial numbness). °· Drooping of the eyelid and corner of the mouth. °· Weakness in the face. °· Paralysis of half your face. °· Loss of taste. °· Sensitivity to loud noises. °· Difficulty chewing. °· Tearing up of the affected eye. °· Dryness in the affected eye. °· Drooling. °· Pain behind one ear. °DIAGNOSIS  °Diagnosis of Bell palsy may include: °· A medical history and physical exam. °· An MRI. °· A CT scan. °· Electromyography (EMG). This is a test that checks how your nerves are working. °TREATMENT  °Treatment may include antiviral medicine to help shorten the length of the condition. Sometimes treatment is not needed and the symptoms go away on their own. °HOME CARE INSTRUCTIONS  °· Take medicines only as directed by your health care provider. °· Do facial massages and exercises as directed by your health care provider. °· If your eye is affected: °¨ Use moisturizing eye drops to prevent drying of your eye as directed by your health care provider. °¨ Protect your eye as directed by your health care provider. °SEEK MEDICAL CARE IF: °· Your symptoms do not get better or get worse. °· You are drooling. °· Your eye is red,  irritated, or hurts. °SEEK IMMEDIATE MEDICAL CARE IF:  °· Another part of your body feels weak or numb. °· You have difficulty swallowing. °· You have a fever along with symptoms of Bell palsy. °· You develop neck pain. °MAKE SURE YOU:  °· Understand these instructions. °· Will watch your condition. °· Will get help right away if you are not doing well or get worse. °  °This information is not intended to replace advice given to you by your health care provider. Make sure you discuss any questions you have with your health care provider. °  °Document Released: 11/05/2005 Document Revised: 07/27/2015 Document Reviewed: 02/12/2014 °Elsevier Interactive Patient Education ©2016 [employer] ° °

## 2016-03-09 NOTE — ED Notes (Signed)
Pt states she has had a watery right eye. Pt's husband states that the pt seems "spacy" today. Pt states she has had a metallic taste in her mouth x 2 days. Pt states her vision in her right eye has changed today, but has an otherwise unremarkable neuro assessment.

## 2016-03-09 NOTE — ED Notes (Signed)
Pt c/o "metal" taste in mouth x 2 days and unable to close R eye starting around 1030.  Denies pain.  Denies new medications.  Denies weakness, numbness, and tingling.  Mouth asymmetry noted.  Pt seems to be only blinking w/ L eye.

## 2016-04-05 ENCOUNTER — Other Ambulatory Visit (HOSPITAL_COMMUNITY)
Admission: RE | Admit: 2016-04-05 | Discharge: 2016-04-05 | Disposition: A | Discharge status: Home / Self Care | Payer: BLUE CROSS/BLUE SHIELD | Source: Ambulatory Visit | Attending: Family Medicine | Admitting: Family Medicine

## 2016-04-05 ENCOUNTER — Other Ambulatory Visit: Payer: Self-pay | Admitting: Family Medicine

## 2016-04-05 DIAGNOSIS — Z01411 Encounter for gynecological examination (general) (routine) with abnormal findings: Secondary | ICD-10-CM | POA: Insufficient documentation

## 2016-04-09 LAB — CYTOLOGY - PAP

## 2016-08-08 ENCOUNTER — Ambulatory Visit (INDEPENDENT_AMBULATORY_CARE_PROVIDER_SITE_OTHER): Payer: 59 | Admitting: Family Medicine

## 2016-08-08 ENCOUNTER — Encounter: Payer: Self-pay | Admitting: Family Medicine

## 2016-08-08 DIAGNOSIS — N951 Menopausal and female climacteric states: Secondary | ICD-10-CM

## 2016-08-08 DIAGNOSIS — F329 Major depressive disorder, single episode, unspecified: Secondary | ICD-10-CM | POA: Diagnosis not present

## 2016-08-08 DIAGNOSIS — Z8669 Personal history of other diseases of the nervous system and sense organs: Secondary | ICD-10-CM

## 2016-08-08 DIAGNOSIS — F32A Depression, unspecified: Secondary | ICD-10-CM | POA: Insufficient documentation

## 2016-08-08 DIAGNOSIS — F4323 Adjustment disorder with mixed anxiety and depressed mood: Secondary | ICD-10-CM | POA: Diagnosis not present

## 2016-08-08 DIAGNOSIS — Z6830 Body mass index (BMI) 30.0-30.9, adult: Secondary | ICD-10-CM | POA: Diagnosis not present

## 2016-08-08 DIAGNOSIS — J3089 Other allergic rhinitis: Secondary | ICD-10-CM

## 2016-08-08 NOTE — Progress Notes (Signed)
New patient office visit note:  Impression and Recommendations:    1. Perimenopausal hot flashes.   - Gave patient handout from [facility] on alternative treatments to hot flashes such as black cohosh etc. Patient declines medications- says they're not Bad.   2. BMI 30.0-30.9,adult   - Explained to patient what BMI refers to, and what it means medically.    Told patient to think about it as a "medical risk stratification measurement" and how increasing BMI is associated with increasing risk/ or worsening state of various diseases such as hypertension, hyperlipidemia, diabetes, premature OA, depression etc.  American Heart Association guidelines for healthy diet, basically Mediterranean diet, and exercise guidelines of 30 minutes 5 days per week or more discussed in detail.  Health counseling performed.  She asked what ideal BMI\weight would be. Less than 155 is ideal.  Advised to use lose it or my fitness pal apps TO track all food  All questions answered.    3. Depression   - Continue medications for now.  Mood stable. Discussed exercise 30 minutes every day would help with mood and stress   4. Adjustment disorder with mixed anxiety and depressed mood  - as above   5. Environmental and seasonal allergies   - Patient understands to take Allegra D or Zyrtec-D but not both together. Advise Nettie pot and adding Flonase if needed    6. History of migraines   - stable.  Patient at times will use Tylenol or ibuprofen occasionally for headache     Depression Many yrs. Worse with each child.   Lost Dad in July '17   Saw psychiatrist yrs ago.- about 6-7 yrs ago.   Adjustment disorder with mixed anxiety and depressed mood occ needs to take Xanax- once wkly on average.   COme fasting next OV- will obtain cbc, cmp, flp, tsh, a1c, vit d   New Prescriptions   No medications on file    Modified Medications   No medications on file    Discontinued Medications   ARTIFICIAL TEARS (LACRILUBE) OINT OPHTHALMIC OINTMENT    Place into the right eye every 2 (two) hours as needed for dry eyes.   MULTIPLE VITAMIN (MULTIVITAMIN WITH MINERALS) TABS    Take 1 tablet by mouth daily.   PREDNISONE (DELTASONE) 10 MG TABLET    Take 6 tablets (60 mg total) by mouth daily. Take 6 tablets a day 5 days then 1 tablet per day 5 days    Current Meds  Medication Sig  . acetaminophen (TYLENOL) 500 MG tablet Take 1,000 mg by mouth every 6 (six) hours as needed for moderate pain or headache. Pain  . ALPRAZolam (XANAX) 0.25 MG tablet Take 0.25 mg by mouth daily as needed. anxiety  . aspirin EC 81 MG tablet Take 81 mg by mouth daily.  Marland Kitchen. buPROPion (WELLBUTRIN XL) 300 MG 24 hr tablet Take 300 mg by mouth daily.  . cetirizine-pseudoephedrine (ZYRTEC-D) 5-120 MG per tablet Take 1 tablet by mouth daily as needed for allergies.   Marland Kitchen. escitalopram (LEXAPRO) 10 MG tablet Take 10 mg by mouth daily.  . fexofenadine-pseudoephedrine (ALLEGRA-D) 60-120 MG 12 hr tablet Take 1 tablet by mouth 2 (two) times daily as needed (allergies).  Marland Kitchen. ibuprofen (ADVIL,MOTRIN) 200 MG tablet Take 400 mg by mouth every 6 (six) hours as needed. Pain  . Multiple Vitamin (MULTIVITAMIN) tablet Take 1 tablet by mouth daily.  . [DISCONTINUED] Multiple Vitamin (MULTIVITAMIN WITH MINERALS) TABS Take 1  tablet by mouth daily.    The patient was counseled, risk factors were discussed, anticipatory guidance given.  Gross side effects, risk and benefits, and alternatives of medications discussed with patient.  Patient is aware that all medications have potential side effects and we are unable to predict every side effect or drug-drug interaction that may occur.  Expresses verbal understanding and consents to current therapy plan and treatment regimen.  Return in about 2 months (around 10/08/2016) for Fasting blood work, Follow-up of current medical issues.  Please see AVS handed out to patient at the end of our visit for  further patient instructions/ counseling done pertaining to today's office visit.    Note: This document was prepared using Dragon voice recognition software and may include unintentional dictation errors.  ----------------------------------------------------------------------------------------------------------------------    Subjective:    Chief Complaint  Patient presents with  . Establish Care    HPI: Stephanie Chan is a pleasant 50 y.o. female who presents to [facility] today to review their medical history with me and establish care.   Prior PCP- [facility] Sprang Doc- on [address].   Selinda Eon- [address], [place] Peon 50yo.   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  CPE- at [facility]---> last seen in May  Tries to exercise- lost 10lbs in last one year----> walks a couple days per week. [place].   Migraines- used to have them more freq, not any more.  - Hasn't had her period in over a year.  Seasonal All:  Takes allegra D or Zyrtec D- not together  Mood d/o:  Has struggled most her life. W w each child and more stressors of life.  Recently lost her father- and she went up on her lexapro--> she was at 1/2 tab daily in July. She feels better and states she is able to handle her emotions more   Wt Readings from Last 3 Encounters:  08/08/16 189 lb 8 oz (86 kg)   BP Readings from Last 3 Encounters:  08/08/16 106/73  03/09/16 124/76  04/09/13 119/70   Pulse Readings from Last 3 Encounters:  08/08/16 71  03/09/16 80  04/09/13 85   BMI Readings from Last 3 Encounters:  08/08/16 30.59 kg/m      Patient Active Problem List   Diagnosis Date Noted  . Perimenopausal hot flashes.  08/08/2016  . BMI 30.0-30.9,adult 08/08/2016  . Depression 08/08/2016  . Adjustment disorder with mixed anxiety and depressed mood 08/08/2016  . Environmental and seasonal allergies 08/08/2016  . History of migraines  08/08/2016     Past Medical History:  Diagnosis Date  . Bundle branch block   . Depression   . Migraines   . Panic attack   . Vertigo      Past Medical History:  Diagnosis Date  . Bundle branch block   . Depression   . Migraines   . Panic attack   . Vertigo      Past Surgical History:  Procedure Laterality Date  . CESAREAN SECTION       Family History  Problem Relation Age of Onset  . Thyroid disease Mother 49  . Kidney disease Father 51  . Heart disease Father 73  . Healthy Sister   . Healthy Brother   . Healthy Daughter   . Healthy Sister   . Healthy Sister   . Healthy Daughter   . Healthy Daughter      History  Drug Use No    History  Alcohol Use  . 1.2 oz/week  . 2 Glasses of wine per week    Comment: socially    History  Smoking Status  . Never Smoker  Smokeless Tobacco  . Never Used     Patient's Medications  New Prescriptions   No medications on file  Previous Medications   ACETAMINOPHEN (TYLENOL) 500 MG TABLET    Take 1,000 mg by mouth every 6 (six) hours as needed for moderate pain or headache. Pain   ALPRAZOLAM (XANAX) 0.25 MG TABLET    Take 0.25 mg by mouth daily as needed. anxiety   ASPIRIN EC 81 MG TABLET    Take 81 mg by mouth daily.   BUPROPION (WELLBUTRIN XL) 300 MG 24 HR TABLET    Take 300 mg by mouth daily.   CETIRIZINE-PSEUDOEPHEDRINE (ZYRTEC-D) 5-120 MG PER TABLET    Take 1 tablet by mouth daily as needed for allergies.    ESCITALOPRAM (LEXAPRO) 10 MG TABLET    Take 10 mg by mouth daily.   FEXOFENADINE-PSEUDOEPHEDRINE (ALLEGRA-D) 60-120 MG 12 HR TABLET    Take 1 tablet by mouth 2 (two) times daily as needed (allergies).   IBUPROFEN (ADVIL,MOTRIN) 200 MG TABLET    Take 400 mg by mouth every 6 (six) hours as needed. Pain   MULTIPLE VITAMIN (MULTIVITAMIN) TABLET    Take 1 tablet by mouth daily.  Modified Medications   No medications on file  Discontinued Medications   ARTIFICIAL TEARS (LACRILUBE) OINT OPHTHALMIC OINTMENT     Place into the right eye every 2 (two) hours as needed for dry eyes.   MULTIPLE VITAMIN (MULTIVITAMIN WITH MINERALS) TABS    Take 1 tablet by mouth daily.   PREDNISONE (DELTASONE) 10 MG TABLET    Take 6 tablets (60 mg total) by mouth daily. Take 6 tablets a day 5 days then 1 tablet per day 5 days    Allergies: Review of patient's allergies indicates no known allergies.  Review of Systems  Constitutional: Negative.   HENT: Negative.   Eyes: Negative.   Respiratory: Negative.   Cardiovascular: Negative.   Gastrointestinal: Negative.   Genitourinary: Negative.   Musculoskeletal: Negative.   Skin: Negative.   Neurological: Negative.   Endo/Heme/Allergies: Negative.   Psychiatric/Behavioral: Negative.      Objective:  Blood pressure 106/73, pulse 71, height 5\' 6"  (1.676 m), weight 189 lb 8 oz (86 kg). Body mass index is 30.59 kg/m. General: Well Developed, well nourished, and in no acute distress.  Neuro: Alert and oriented x3, extra-ocular muscles intact, sensation grossly intact.  HEENT: Normocephalic, atraumatic, pupils equal round reactive to light, neck supple, no gross masses, no carotid bruits, no JVD apprec Skin: no gross suspicious lesions or rashes  Cardiac: Regular rate and rhythm, no murmurs rubs or gallops.  Respiratory: Essentially clear to auscultation bilaterally. Not using accessory muscles, speaking in full sentences.  Abdominal: Soft, not grossly distended Musculoskeletal: Ambulates w/o diff, FROM * 4 ext.  Vasc: less 2 sec cap RF, warm and pink  Psych:  No HI/SI, judgement and insight good, Euthymic mood. Full Affect.

## 2016-08-08 NOTE — Assessment & Plan Note (Signed)
occ needs to take Xanax- once wkly on average.

## 2016-08-08 NOTE — Assessment & Plan Note (Addendum)
>>  ASSESSMENT AND PLAN FOR DEPRESSION WRITTEN ON 08/08/2016 10:39 AM BY Taniqua Issa, DO  Many yrs. Worse with each child.   Lost Dad in July '17   Saw psychiatrist yrs ago.- about 6-7 yrs ago.   >>ASSESSMENT AND PLAN FOR ADJUSTMENT DISORDER WITH MIXED ANXIETY AND DEPRESSED MOOD WRITTEN ON 08/08/2016 10:41 AM BY Saranya Harlin, DO  occ needs to take Xanax- once wkly on average.

## 2016-08-08 NOTE — Patient Instructions (Addendum)
Ultimate goal weight in lbs---> less than 155  Lose it or myfitnesspal  Goal is 9mn moderate intensity exercise daily    Stress and Stress Management Stress is a normal reaction to life events. It is what you feel when life demands more than you are used to or more than you can handle. Some stress can be useful. For example, the stress reaction can help you catch the last bus of the day, study for a test, or meet a deadline at work. But stress that occurs too often or for too long can cause problems. It can affect your emotional health and interfere with relationships and normal daily activities. Too much stress can weaken your immune system and increase your risk for physical illness. If you already have a medical problem, stress can make it worse. CAUSES  All sorts of life events may cause stress. An event that causes stress for one person may not be stressful for another person. Major life events commonly cause stress. These may be positive or negative. Examples include losing your job, moving into a new home, getting married, having a baby, or losing a loved one. Less obvious life events may also cause stress, especially if they occur day after day or in combination. Examples include working long hours, driving in traffic, caring for children, being in debt, or being in a difficult relationship. SIGNS AND SYMPTOMS Stress may cause emotional symptoms including, the following:  Anxiety. This is feeling worried, afraid, on edge, overwhelmed, or out of control.  Anger. This is feeling irritated or impatient.  Depression. This is feeling sad, down, helpless, or guilty.  Difficulty focusing, remembering, or making decisions. Stress may cause physical symptoms, including the following:   Aches and pains. These may affect your head, neck, back, stomach, or other areas of your body.  Tight muscles or clenched jaw.  Low energy or trouble sleeping. Stress may cause unhealthy behaviors,  including the following:   Eating to feel better (overeating) or skipping meals.  Sleeping too little, too much, or both.  Working too much or putting off tasks (procrastination).  Smoking, drinking alcohol, or using drugs to feel better. DIAGNOSIS  Stress is diagnosed through an assessment by your health care provider. Your health care provider will ask questions about your symptoms and any stressful life events.Your health care provider will also ask about your medical history and may order blood tests or other tests. Certain medical conditions and medicine can cause physical symptoms similar to stress. Mental illness can cause emotional symptoms and unhealthy behaviors similar to stress. Your health care provider may refer you to a mental health professional for further evaluation.  TREATMENT  Stress management is the recommended treatment for stress.The goals of stress management are reducing stressful life events and coping with stress in healthy ways.  Techniques for reducing stressful life events include the following:  Stress identification. Self-monitor for stress and identify what causes stress for you. These skills may help you to avoid some stressful events.  Time management. Set your priorities, keep a calendar of events, and learn to say "no." These tools can help you avoid making too many commitments. Techniques for coping with stress include the following:  Rethinking the problem. Try to think realistically about stressful events rather than ignoring them or overreacting. Try to find the positives in a stressful situation rather than focusing on the negatives.  Exercise. Physical exercise can release both physical and emotional tension. The key is to find a form of  exercise you enjoy and do it regularly.  Relaxation techniques. These relax the body and mind. Examples include yoga, meditation, tai chi, biofeedback, deep breathing, progressive muscle relaxation, listening to  music, being out in nature, journaling, and other hobbies. Again, the key is to find one or more that you enjoy and can do regularly.  Healthy lifestyle. Eat a balanced diet, get plenty of sleep, and do not smoke. Avoid using alcohol or drugs to relax.  Strong support network. Spend time with family, friends, or other people you enjoy being around.Express your feelings and talk things over with someone you trust. Counseling or talktherapy with a mental health professional may be helpful if you are having difficulty managing stress on your own. Medicine is typically not recommended for the treatment of stress.Talk to your health care provider if you think you need medicine for symptoms of stress. HOME CARE INSTRUCTIONS  Keep all follow-up visits as directed by your health care provider.  Take all medicines as directed by your health care provider. SEEK MEDICAL CARE IF:  Your symptoms get worse or you start having new symptoms.  You feel overwhelmed by your problems and can no longer manage them on your own. SEEK IMMEDIATE MEDICAL CARE IF:  You feel like hurting yourself or someone else.   This information is not intended to replace advice given to you by your health care provider. Make sure you discuss any questions you have with your health care provider.   Document Released: 05/01/2001 Document Revised: 11/26/2014 Document Reviewed: 06/30/2013 Elsevier Interactive Patient Education [employer].

## 2016-08-31 ENCOUNTER — Telehealth: Payer: Self-pay | Admitting: Family Medicine

## 2016-08-31 NOTE — Telephone Encounter (Signed)
Patient requesting a refill of alprazolam, bupropion, and escitalopram.

## 2016-08-31 NOTE — Telephone Encounter (Signed)
We'll need to find out when her last refill of her alprazolam was, and, how many tabs were dispensed.   This can be done through the [facility] substances website Archie Pattenonya which may be the best route or through her pharmacy.      Okay to refill Buproprion and escitalopram for 60 d only b/c she has only seen in our clinic one time and she was told to follow-up I believe mid-November.  So, she needs a follow-up OV with me and have labs etc. in the near future

## 2016-09-03 MED ORDER — ESCITALOPRAM OXALATE 10 MG PO TABS: 10.0000 mg | ORAL_TABLET | Freq: Every day | ORAL | 0 refills | Status: DC

## 2016-09-03 MED ORDER — BUPROPION HCL ER (XL) 300 MG PO TB24: 300.0000 mg | ORAL_TABLET | Freq: Every day | ORAL | 0 refills | Status: DC

## 2016-09-03 NOTE — Addendum Note (Signed)
Addended by: Stan HeadNELSON, Vincenta Steffey S on: 09/03/2016 08:56 AM   Modules accepted: Orders

## 2016-09-03 NOTE — Telephone Encounter (Signed)
Pt received RX of alprazolam on 09/01/16 for #30 from Mila PalmerWolters, [facility].  Pt states that she did not realize that she had refills on her previous RX from her prior PCP for this medication and this is how she got her refill.    Pt has appointments for 09/26/2016- labs and 10/04/16 for OV.  Tiajuana Amass. Mccormick Macon, CMA

## 2016-09-25 ENCOUNTER — Other Ambulatory Visit: Payer: Self-pay

## 2016-09-25 DIAGNOSIS — F329 Major depressive disorder, single episode, unspecified: Secondary | ICD-10-CM

## 2016-09-25 DIAGNOSIS — Z1329 Encounter for screening for other suspected endocrine disorder: Secondary | ICD-10-CM

## 2016-09-25 DIAGNOSIS — Z1321 Encounter for screening for nutritional disorder: Secondary | ICD-10-CM

## 2016-09-25 DIAGNOSIS — F32A Depression, unspecified: Secondary | ICD-10-CM

## 2016-09-25 DIAGNOSIS — Z130 Encounter for screening for diseases of the blood and blood-forming organs and certain disorders involving the immune mechanism: Secondary | ICD-10-CM

## 2016-09-25 DIAGNOSIS — N951 Menopausal and female climacteric states: Secondary | ICD-10-CM

## 2016-09-25 DIAGNOSIS — Z131 Encounter for screening for diabetes mellitus: Secondary | ICD-10-CM

## 2016-09-25 DIAGNOSIS — Z13220 Encounter for screening for lipoid disorders: Secondary | ICD-10-CM

## 2016-09-26 ENCOUNTER — Other Ambulatory Visit (INDEPENDENT_AMBULATORY_CARE_PROVIDER_SITE_OTHER): Payer: 59

## 2016-09-26 DIAGNOSIS — Z1321 Encounter for screening for nutritional disorder: Secondary | ICD-10-CM

## 2016-09-26 DIAGNOSIS — Z131 Encounter for screening for diabetes mellitus: Secondary | ICD-10-CM

## 2016-09-26 DIAGNOSIS — F329 Major depressive disorder, single episode, unspecified: Secondary | ICD-10-CM

## 2016-09-26 DIAGNOSIS — Z13220 Encounter for screening for lipoid disorders: Secondary | ICD-10-CM | POA: Diagnosis not present

## 2016-09-26 DIAGNOSIS — Z1329 Encounter for screening for other suspected endocrine disorder: Secondary | ICD-10-CM

## 2016-09-26 DIAGNOSIS — N951 Menopausal and female climacteric states: Secondary | ICD-10-CM

## 2016-09-26 DIAGNOSIS — F32A Depression, unspecified: Secondary | ICD-10-CM

## 2016-09-26 DIAGNOSIS — Z130 Encounter for screening for diseases of the blood and blood-forming organs and certain disorders involving the immune mechanism: Secondary | ICD-10-CM

## 2016-09-26 LAB — CBC WITH DIFFERENTIAL/PLATELET
BASOS ABS: 40 {cells}/uL (ref 0–200)
Basophils Relative: 1 %
EOS ABS: 80 {cells}/uL (ref 15–500)
Eosinophils Relative: 2 %
HCT: 37.6 % (ref 35.0–45.0)
Hemoglobin: 12.7 g/dL (ref 11.7–15.5)
LYMPHS PCT: 32 %
Lymphs Abs: 1280 cells/uL (ref 850–3900)
MCH: 29.5 pg (ref 27.0–33.0)
MCHC: 33.8 g/dL (ref 32.0–36.0)
MCV: 87.2 fL (ref 80.0–100.0)
MONOS PCT: 8 %
MPV: 10.8 fL (ref 7.5–12.5)
Monocytes Absolute: 320 cells/uL (ref 200–950)
Neutro Abs: 2280 cells/uL (ref 1500–7800)
Neutrophils Relative %: 57 %
PLATELETS: 195 10*3/uL (ref 140–400)
RBC: 4.31 MIL/uL (ref 3.80–5.10)
RDW: 13.1 % (ref 11.0–15.0)
WBC: 4 10*3/uL (ref 3.8–10.8)

## 2016-09-27 LAB — COMPLETE METABOLIC PANEL WITH GFR
ALT: 11 U/L (ref 6–29)
AST: 11 U/L (ref 10–35)
Albumin: 4.5 g/dL (ref 3.6–5.1)
Alkaline Phosphatase: 88 U/L (ref 33–115)
BUN: 20 mg/dL (ref 7–25)
CHLORIDE: 103 mmol/L (ref 98–110)
CO2: 29 mmol/L (ref 20–31)
Calcium: 9.5 mg/dL (ref 8.6–10.2)
Creat: 0.83 mg/dL (ref 0.50–1.10)
GFR, EST NON AFRICAN AMERICAN: 83 mL/min (ref >=60)
Glucose, Bld: 86 mg/dL (ref 65–99)
POTASSIUM: 4.3 mmol/L (ref 3.5–5.3)
Sodium: 141 mmol/L (ref 135–146)
Total Bilirubin: 0.5 mg/dL (ref 0.2–1.2)
Total Protein: 6.8 g/dL (ref 6.1–8.1)

## 2016-09-27 LAB — HEMOGLOBIN A1C
HEMOGLOBIN A1C: 4.9 % (ref <5.7)
Mean Plasma Glucose: 94 mg/dL

## 2016-09-27 LAB — LIPID PANEL
CHOL/HDL RATIO: 3.5 ratio (ref <5.0)
CHOLESTEROL: 204 mg/dL — AB (ref <200)
HDL: 58 mg/dL (ref >50)
LDL CALC: 130 mg/dL — AB (ref <100)
TRIGLYCERIDES: 78 mg/dL (ref <150)
VLDL: 16 mg/dL (ref <30)

## 2016-09-27 LAB — VITAMIN D 25 HYDROXY (VIT D DEFICIENCY, FRACTURES): VIT D 25 HYDROXY: 29 ng/mL — AB (ref 30–100)

## 2016-09-27 LAB — TSH: TSH: 0.7 mIU/L

## 2016-10-05 ENCOUNTER — Encounter: Payer: Self-pay | Admitting: Family Medicine

## 2016-10-05 ENCOUNTER — Ambulatory Visit (INDEPENDENT_AMBULATORY_CARE_PROVIDER_SITE_OTHER): Payer: 59 | Admitting: Family Medicine

## 2016-10-05 VITALS — BP 112/72 | HR 87 | Ht 66.0 in | Wt 192.4 lb

## 2016-10-05 DIAGNOSIS — E559 Vitamin D deficiency, unspecified: Secondary | ICD-10-CM | POA: Insufficient documentation

## 2016-10-05 DIAGNOSIS — Z6830 Body mass index (BMI) 30.0-30.9, adult: Secondary | ICD-10-CM | POA: Diagnosis not present

## 2016-10-05 DIAGNOSIS — F32A Depression, unspecified: Secondary | ICD-10-CM

## 2016-10-05 DIAGNOSIS — F329 Major depressive disorder, single episode, unspecified: Secondary | ICD-10-CM | POA: Diagnosis not present

## 2016-10-05 DIAGNOSIS — F4323 Adjustment disorder with mixed anxiety and depressed mood: Secondary | ICD-10-CM | POA: Diagnosis not present

## 2016-10-05 DIAGNOSIS — Z7189 Other specified counseling: Secondary | ICD-10-CM

## 2016-10-05 MED ORDER — ESCITALOPRAM OXALATE 10 MG PO TABS: 10.0000 mg | ORAL_TABLET | Freq: Every day | ORAL | 1 refills | Status: DC

## 2016-10-05 MED ORDER — BUPROPION HCL ER (XL) 300 MG PO TB24: 300.0000 mg | ORAL_TABLET | Freq: Every day | ORAL | 1 refills | Status: DC

## 2016-10-05 NOTE — Progress Notes (Signed)
Assessment and plan:  1. BMI 30.0-30.9,adult   2. Adjustment disorder with mixed anxiety and depressed mood   3. Depression, unspecified depression type   4. Vitamin D insufficiency     Vitamin D insufficiency 5,000 IU -   reck 4-6 months.   BMI 30.0-30.9,adult Explained to patient what BMI refers to, and what it means medically.    Told patient to think about it as a "medical risk stratification measurement" and how increasing BMI is associated with increasing risk/ or worsening state of various diseases such as hypertension, hyperlipidemia, diabetes, premature OA, depression etc.  American Heart Association guidelines for healthy diet, basically Mediterranean diet, and exercise guidelines of 30 minutes 5 days per week or more discussed in detail.  Health counseling performed.  All questions answered.  Adjustment disorder with mixed anxiety and depressed mood occ needs to take Xanax- once wkly on average-  Told to use sparingly!  Depression RF lexapro and wellbutrin- which she had been on prior  many yrs.  Tol well and mood better since her last PCP inc her lexapro this past early summer.   - No change in dose. RF's given.      New Prescriptions   No medications on file    Modified Medications   Modified Medication Previous Medication   BUPROPION (WELLBUTRIN XL) 300 MG 24 HR TABLET buPROPion (WELLBUTRIN XL) 300 MG 24 hr tablet      Take 1 tablet (300 mg total) by mouth daily.    Take 1 tablet (300 mg total) by mouth daily.   ESCITALOPRAM (LEXAPRO) 10 MG TABLET escitalopram (LEXAPRO) 10 MG tablet      Take 1 tablet (10 mg total) by mouth daily.    Take 1 tablet (10 mg total) by mouth daily.    Discontinued Medications   No medications on file    Return in about 6 months (around 04/04/2017) for For wellness exam & health maintenance evaluation.  Anticipatory guidance and routine counseling done re:  condition, txmnt options and need for follow up. All questions of patient's were answered.   Gross side effects, risk and benefits, and alternatives of medications discussed with patient.  Patient is aware that all medications have potential side effects and we are unable to predict every sideeffect or drug-drug interaction that may occur.  Expresses verbal understanding and consents to current therapy plan and treatment regiment.  Please see AVS handed out to patient at the end of our visit for additional patient instructions/ counseling done pertaining to today's office visit.  Note: This document was prepared using Dragon voice recognition software and may include unintentional dictation errors.   ----------------------------------------------------------------------------------------------------------------------  Subjective:   CC:   Stephanie Chan is a 50 y.o. female who presents to [facility] today for review and discussion of recent bloodwork that was done.  1. All recent blood work that we ordered was reviewed with patient today.  Patient was counseled on all abnormalities and we discussed dietary and lifestyle changes that could help those values (also medications when appropriate).  Extensive health counseling performed and all patient's concerns/ questions were addressed.  2. Uses xanax once monthly or less  3. Last OV d/c pt:   Ultimate goal weight in lbs---> less than 155  Use Lose it or myfitnesspal  Goal is 30mn moderate intensity exercise daily   Wt Readings from Last 3 Encounters:  10/05/16 192 lb 6.4 oz (87.3 kg)  08/08/16 189 lb 8 oz (86 kg)   BP Readings from Last 3 Encounters:  10/05/16 112/72  08/08/16 106/73  03/09/16 124/76   Pulse Readings from Last 3 Encounters:  10/05/16 87  08/08/16 71  03/09/16 80   BMI Readings from Last 3 Encounters:  10/05/16 31.05 kg/m  08/08/16 30.59 kg/m     Patient Care Team     Relationship Specialty Notifications Start End  Mellody Dance, DO PCP - General Family Medicine  08/08/16     Full medical history updated and reviewed in the office today  Patient Active Problem List   Diagnosis Date Noted  . Vitamin D insufficiency 10/05/2016  . Perimenopausal hot flashes.  08/08/2016  . BMI 30.0-30.9,adult 08/08/2016  . Depression 08/08/2016  . Adjustment disorder with mixed anxiety and depressed mood 08/08/2016  . Environmental and seasonal allergies 08/08/2016  . History of migraines 08/08/2016    Past Medical History:  Diagnosis Date  . Bundle branch block   . Depression   . Migraines   . Panic attack   . Vertigo     Past Surgical History:  Procedure Laterality Date  . CESAREAN SECTION      Social History  Substance Use Topics  . Smoking status: Never Smoker  . Smokeless tobacco: Never Used  . Alcohol use 1.2 oz/week    2 Glasses of wine per week     Comment: socially    Family Hx: Family History  Problem Relation Age of Onset  . Thyroid disease Mother 65  . Kidney disease Father 76  . Heart disease Father 80  . Healthy Sister   . Healthy Brother   . Healthy Daughter   . Healthy Sister   . Healthy Sister   . Healthy Daughter   . Healthy Daughter      Medications: Current Outpatient Prescriptions  Medication Sig Dispense Refill  . acetaminophen (TYLENOL) 500 MG tablet Take 1,000 mg by mouth every 6 (six) hours as needed for moderate pain or headache. Pain    . ALPRAZolam (XANAX) 0.25 MG tablet Take 0.25 mg by mouth daily as needed. anxiety    . aspirin EC 81 MG tablet Take 81 mg by mouth daily.    Marland Kitchen buPROPion (WELLBUTRIN XL) 300 MG 24 hr tablet Take 1 tablet (300 mg total) by mouth daily. 90 tablet 1  . cetirizine-pseudoephedrine (ZYRTEC-D) 5-120 MG per tablet Take 1 tablet by mouth daily as needed for allergies.     Marland Kitchen escitalopram (LEXAPRO) 10 MG tablet Take 1 tablet (10 mg total) by mouth daily. 90 tablet 1  .  fexofenadine-pseudoephedrine (ALLEGRA-D) 60-120 MG 12 hr tablet Take 1 tablet by mouth 2 (two) times daily as needed (allergies).    Marland Kitchen ibuprofen (ADVIL,MOTRIN) 200 MG tablet Take 400 mg by mouth every 6 (six) hours as needed. Pain    . Multiple Vitamin (MULTIVITAMIN) tablet Take 1 tablet by mouth daily.     No current facility-administered medications for this visit.     Allergies:  No Known Allergies   ROS: Review of Systems  Constitutional: Negative.  Negative for chills, diaphoresis, fever, malaise/fatigue and weight loss.  HENT: Negative.  Negative for congestion, sore throat and tinnitus.   Eyes: Negative.  Negative for blurred vision, double vision and photophobia.  Respiratory: Negative.  Negative for cough and wheezing.   Cardiovascular: Negative.  Negative for chest pain and palpitations.  Gastrointestinal: Negative.  Negative for blood in stool, diarrhea, nausea and vomiting.  Genitourinary: Negative.  Negative for dysuria, frequency and urgency.  Musculoskeletal: Negative.  Negative for joint pain and myalgias.  Skin: Negative.  Negative for itching and rash.  Neurological: Negative.  Negative for dizziness, focal weakness, weakness and headaches.  Endo/Heme/Allergies: Negative.  Negative for environmental allergies and polydipsia. Does not bruise/bleed easily.  Psychiatric/Behavioral: Negative.  Negative for depression and memory loss. The patient is not nervous/anxious and does not have insomnia.     Objective:  Blood pressure 112/72, pulse 87, height _0  (1.676 m), weight 192 lb 6.4 oz (87.3 kg). Body mass index is 31.05 kg/m. Gen:   Well NAD, A and O *3 HEENT:    [place]/AT, EOMI,  MMM, OP- clr Lungs:   Normal work of breathing. CTA B/L, no Wh, rhonchi Heart:   RRR, S1, S2 WNL's, no MRG Abd:   No gross distention Exts:    warm, pink,  Brisk capillary refill, warm and well perfused.  Psych:    No HI/SI, judgement and insight good, Euthymic mood. Full  Affect.   Recent Results (from the past 2160 hour(s))  Lipid Profile     Status: Abnormal   Collection Time: 09/26/16  8:19 AM  Result Value Ref Range   Cholesterol 204 (H) <200 mg/dL    Comment: ** Please note change in reference range(s). **      Triglycerides 78 <150 mg/dL    Comment: ** Please note change in reference range(s). **      HDL 58 >50 mg/dL    Comment: ** Please note change in reference range(s). **      Total CHOL/HDL Ratio 3.5 <5.0 Ratio   VLDL 16 <30 mg/dL   LDL Cholesterol 130 (H) <100 mg/dL    Comment: ** Please note change in reference range(s). **     CBC w/Diff     Status: None   Collection Time: 09/26/16  8:19 AM  Result Value Ref Range   WBC 4.0 3.8 - 10.8 K/uL   RBC 4.31 3.80 - 5.10 MIL/uL   Hemoglobin 12.7 11.7 - 15.5 g/dL   HCT 37.6 35.0 - 45.0 %   MCV 87.2 80.0 - 100.0 fL   MCH 29.5 27.0 - 33.0 pg   MCHC 33.8 32.0 - 36.0 g/dL   RDW 13.1 11.0 - 15.0 %   Platelets 195 140 - 400 K/uL   MPV 10.8 7.5 - 12.5 fL   Neutro Abs 2,280 1,500 - 7,800 cells/uL   Lymphs Abs 1,280 850 - 3,900 cells/uL   Monocytes Absolute 320 200 - 950 cells/uL   Eosinophils Absolute 80 15 - 500 cells/uL   Basophils Absolute 40 0 - 200 cells/uL   Neutrophils Relative % 57 %   Lymphocytes Relative 32 %   Monocytes Relative 8 %   Eosinophils Relative 2 %   Basophils Relative 1 %   Smear Review Criteria for review not met   COMPLETE METABOLIC PANEL WITH GFR     Status: None   Collection Time: 09/26/16  8:19 AM  Result Value Ref Range   Sodium 141 135 - 146 mmol/L   Potassium 4.3 3.5 - 5.3 mmol/L   Chloride 103 98 - 110 mmol/L   CO2 29 20 - 31 mmol/L   Glucose, Bld 86 65 - 99 mg/dL   BUN 20 7 - 25 mg/dL   Creat 0.83 0.50 - 1.10 mg/dL   Total Bilirubin 0.5 0.2 - 1.2 mg/dL   Alkaline Phosphatase 88 33 - 115 U/L   AST 11  10 - 35 U/L   ALT 11 6 - 29 U/L   Total Protein 6.8 6.1 - 8.1 g/dL   Albumin 4.5 3.6 - 5.1 g/dL   Calcium 9.5 8.6 - 10.2 mg/dL   GFR, Est  African American >89 >=60 mL/min   GFR, Est Non African American 83 >=60 mL/min  TSH     Status: None   Collection Time: 09/26/16  8:19 AM  Result Value Ref Range   TSH 0.70 mIU/L    Comment:   Reference Range   > or = 20 Years  0.40-4.50   Pregnancy Range First trimester  0.26-2.66 Second trimester 0.55-2.73 Third trimester  0.43-2.91     Vitamin D (25 hydroxy)     Status: Abnormal   Collection Time: 09/26/16  8:19 AM  Result Value Ref Range   Vit D, 25-Hydroxy 29 (L) 30 - 100 ng/mL    Comment: Vitamin D Status           25-OH Vitamin D        Deficiency                <20 ng/mL        Insufficiency         20 - 29 ng/mL        Optimal             > or = 30 ng/mL   For 25-OH Vitamin D testing on patients on D2-supplementation and patients for whom quantitation of D2 and D3 fractions is required, the QuestAssureD 25-OH VIT D, (D2,D3), LC/MS/MS is recommended: order code [phone] (patients > 2 yrs).   HgB A1c     Status: None   Collection Time: 09/26/16  8:19 AM  Result Value Ref Range   Hgb A1c MFr Bld 4.9 <5.7 %    Comment:   For the purpose of screening for the presence of diabetes:   <5.7%       Consistent with the absence of diabetes 5.7-6.4 %   Consistent with increased risk for diabetes (prediabetes) >=6.5 %     Consistent with diabetes   This assay result is consistent with a decreased risk of diabetes.   Currently, no consensus exists regarding use of hemoglobin A1c for diagnosis of diabetes in children.   According to American Diabetes Association (ADA) guidelines, hemoglobin A1c <7.0% represents optimal control in non-pregnant diabetic patients. Different metrics may apply to specific patient populations. Standards of Medical Care in Diabetes (ADA).      Mean Plasma Glucose 94 mg/dL

## 2016-10-05 NOTE — Patient Instructions (Addendum)
[email or web address].   Let me know if you need psychiatry referral.   d3 5000 IU per day- reck [id number]   Guidelines for a Low Cholesterol, Low Saturated Fat Diet   Fats - Limit total intake of fats and oils. - Avoid butter, stick margarine, shortening, lard, palm and coconut oils. - Limit mayonnaise, salad dressings, gravies and sauces, unless they are homemade with low-fat ingredients. - Limit chocolate. - Choose low-fat and nonfat products, such as low-fat mayonnaise, low-fat or non-hydrogenated peanut butter, low-fat or fat-free salad dressings and nonfat gravy. - Use vegetable oil, such as canola or olive oil. - Look for margarine that does not contain trans fatty acids. - Use nuts in moderate amounts. - Read ingredient labels carefully to determine both amount and type of fat present in foods. Limit saturated and trans fats! - Avoid high-fat processed and convenience foods.  Meats and Meat Alternatives - Choose fish, chicken, [place]key and lean meats. - Use dried beans, peas, lentils and tofu. - Limit egg yolks to three to four per week. - If you eat red meat, limit to no more than three servings per week and choose loin or round cuts. - Avoid fatty meats, such as bacon, sausage, franks, luncheon meats and ribs. - Avoid all organ meats, including liver.  Dairy - Choose nonfat or low-fat milk, yogurt and cottage cheese. - Most cheeses are high in fat. Choose cheeses made from non-fat milk, such as mozzarella and ricotta cheese. - Choose light or fat-free cream cheese and sour cream. - Avoid cream and sauces made with cream.  Fruits and Vegetables - Eat a wide variety of fruits and vegetables. - Use lemon juice, vinegar or "mist" olive oil on vegetables. - Avoid adding sauces, fat or oil to vegetables.  Breads, Cereals and Grains - Choose whole-grain breads, cereals, pastas and rice. - Avoid high-fat snack foods, such as granola, cookies, pies, pastries, doughnuts and  croissants.  Cooking Tips - Avoid deep fried foods. - Trim visible fat off meats and remove skin from poultry before cooking. - Bake, broil, boil, poach or roast poultry, fish and lean meats. - Drain and discard fat that drains out of meat as you cook it. - Add little or no fat to foods. - Use vegetable oil sprays to grease pans for cooking or baking. - Steam vegetables. - Use herbs or no-oil marinades to flavor foods.   Please realize, EXERCISE IS MEDICINE!  -  American Heart Association [facility]( AHA) guidelines for exercise : If you are in good health, without any medical conditions, you should engage in 150 minutes of moderate intensity aerobic activity per week.  This means you should be huffing and puffing throughout your workout.   Engaging in regular exercise will improve brain function and memory, as well as improve mood, boost immune system and help with weight management.  As well as the other, more well-known effects of exercise such as decreasing blood sugar levels, decreasing blood pressure,  and decreasing bad cholesterol levels/ increasing good cholesterol levels.     -  The AHA strongly endorses consumption of a diet that contains a variety of foods from all the food categories with an emphasis on fruits and vegetables; fat-free and low-fat dairy products; cereal and grain products; legumes and nuts; and fish, poultry, and/or extra lean meats.    Excessive food intake, especially of foods high in saturated and trans fats, sugar, and salt, should be avoided.    Adequate water intake of roughly 1/2  of your weight in pounds, should equal the ounces of water per day you should drink.  So for instance, if you're 200 pounds, that would be 100 ounces of water per day.

## 2016-10-05 NOTE — Assessment & Plan Note (Signed)
5,000 IU -   reck 4-6 months.

## 2016-10-27 DIAGNOSIS — Z7189 Other specified counseling: Secondary | ICD-10-CM | POA: Insufficient documentation

## 2016-10-27 NOTE — Assessment & Plan Note (Addendum)
>>  ASSESSMENT AND PLAN FOR DEPRESSION WRITTEN ON 10/27/2016  1:30 PM BY Meylin Stenzel, DO  RF lexapro and wellbutrin- which she had been on prior  many yrs.  Tol well and mood better since her last PCP inc her lexapro this past early summer.   - No change in dose. RF's given.    >>ASSESSMENT AND PLAN FOR ADJUSTMENT DISORDER WITH MIXED ANXIETY AND DEPRESSED MOOD WRITTEN ON 10/27/2016  1:32 PM BY Aimi Essner, DO  occ needs to take Xanax- once wkly on average-  Told to use sparingly!

## 2016-10-27 NOTE — Assessment & Plan Note (Signed)

## 2016-10-27 NOTE — Assessment & Plan Note (Addendum)
occ needs to take Xanax- once wkly on average-  Told to use sparingly!

## 2016-10-27 NOTE — Assessment & Plan Note (Signed)
Counseling and recent labs done

## 2017-04-09 ENCOUNTER — Ambulatory Visit (INDEPENDENT_AMBULATORY_CARE_PROVIDER_SITE_OTHER): Payer: 59 | Admitting: Family Medicine

## 2017-04-09 ENCOUNTER — Encounter: Payer: Self-pay | Admitting: Family Medicine

## 2017-04-09 VITALS — BP 99/63 | HR 81 | Temp 98.2°F | Ht 66.0 in | Wt 191.0 lb

## 2017-04-09 DIAGNOSIS — E559 Vitamin D deficiency, unspecified: Secondary | ICD-10-CM | POA: Diagnosis not present

## 2017-04-09 DIAGNOSIS — F4323 Adjustment disorder with mixed anxiety and depressed mood: Secondary | ICD-10-CM

## 2017-04-09 DIAGNOSIS — Z6830 Body mass index (BMI) 30.0-30.9, adult: Secondary | ICD-10-CM

## 2017-04-09 DIAGNOSIS — F329 Major depressive disorder, single episode, unspecified: Secondary | ICD-10-CM | POA: Diagnosis not present

## 2017-04-09 DIAGNOSIS — F32A Depression, unspecified: Secondary | ICD-10-CM

## 2017-04-09 MED ORDER — VITAMIN D3 125 MCG (5000 UT) PO CAPS: 1.0000 | ORAL_CAPSULE | Freq: Every day | ORAL | Status: AC

## 2017-04-09 MED ORDER — ESCITALOPRAM OXALATE 10 MG PO TABS: 10.0000 mg | ORAL_TABLET | Freq: Every day | ORAL | 1 refills | Status: DC

## 2017-04-09 MED ORDER — BUPROPION HCL ER (XL) 300 MG PO TB24: 300.0000 mg | ORAL_TABLET | Freq: Every day | ORAL | 1 refills | Status: DC

## 2017-04-09 NOTE — Patient Instructions (Signed)
Goals for next office visit in 4 months: 1-  5 pound weight loss;  2-visit a counselor.  --> My goals for you: Be more consistent with medications, and be more consistent with treating yourself well.  Please take 5000 IUs of vitamin D3 daily we will recheck this in 6 months  Please consider transcendental meditation, getting more consistent with exercise to a goal of 30 minutes 5 days a week and consider counseling in addition to taking her medicines regularly.   What is Chronic Stress Syndrome, Symptoms & Ways to Deal With it   What is Chronic Stress Syndrome?  Chronic Stress Syndrome is something which can now be called as a medical condition due to the amount of stress an individual is going through these days. Chronic Stress Syndrome causes the body and mind to shutdown and the person has no control over himself or herself. Due to the demands of modern day life and the hardship throughout day and night takes its toll over a period of time and the body and brain starts demanding rest and a break. This leads to certain symptoms where your performance level starts to dip at work, you become irritable both at work and at home, you may stop enjoying activities you previously liked, you may become depressed, you may get angry for even small things. Chronic Stress Syndrome can significantly impact your quality life. Thus it is important understand the symptoms of Chronic Stress Syndrome and react accordingly in order to cope up with it.  It is important to note here that a balanced work-home equation should be drawn to cut down symptoms of Chronic Stress Syndrome. Minor stressors can be overcome by the body's inbuilt stress response but when there is unending stress for a long period of time then an external help is required to ease the stress.  Chronic Stress Syndrome can physically and psychologically drain you over a period of time. For such cases stress management is the best way to cope up with  Chronic Stress Syndrome. If Chronic Stress Syndrome is not treated then it may result in many health hazards like anxiety, muscle pain, insomnia, and high blood pressure along with a compromised immune system leading to frequent infections and missed days from work.    What are the Symptoms of Chronic Stress Syndrome?   The symptoms of Chronic Stress Syndrome are variable and range from generalized symptoms to emotional symptoms along with behavioral and cognitive symptoms. Some of these symptoms have been delineated below:  Generalized Symptoms of Chronic Stress Syndrome are: Anxiety Depression Social isolation Headache Abdominal pain Lack of sleep Back pain Difficulty in concentrating Hypertension Hemorrhoids Varicose veins Panic attacks/ Panic disorder Cardiovascular diseases.   Some of the Emotional Symptoms of Chronic Stress Syndrome are: To become easily agitated, moody and frustrated Feeling overwhelmed which makes you feel like you are losing control. Having difficulty relaxing and have a peaceful mind Having low self esteem Feeling lonely Feeling worthless Feeling depressed Avoiding social environment.   Some of the Physical Symptoms of Chronic Stress Syndrome are: Headaches Lethargy Alternating diarrhea and constipation Nausea Muscles aches and pains Insomnia Rapid heartbeat and chest pain Infections and frequent colds Decreased libido Nervousness and shaking Tinnitus Sweaty palms Dry mouth Clenched jaw.  Some of the Cognitive Symptoms of Chronic Stress Syndrome are: Constant worrying Racing thoughts Disorganization and forgetfulness Inability to focus Poor judgment Abundance of negativity.  Some of the Behavioral Symptoms of Chronic Stress Syndrome are: Changes in appetite with less  desire to eat Avoiding responsibilities Indulgence in alcohol or recreational drug use Increased nail biting and being fidgety Ways to Deal With Chronic Stress  Syndrome    Chronic Stress Syndrome is not something which cannot be addressed. A bit of effort from your side in the form of lifestyle modifications, a little bit of exercise, a balanced work life equation can do wonders and help you get rid of Chronic Stress Syndrome.  Get Proper Sleep: It has been proved that Chronic Stress Syndrome causes loss of sleep where an individual may not even be able to sleep for days unending. This may result in the individual feeling lethargic and unable to focus at work the following morning. This may lead to decreased performance at work. Thus, it is important to have a good sleep-wake cycle. For this, try and not drink any caffeinated beverage about four hours prior to going to sleep, as caffeine pumps up the adrenaline and causes you to stay awake resulting ultimately in Chronic Stress Syndrome.  Avoid Alcohol and Drugs: Another way to get rid of Chronic Stress Syndrome is lifestyle modifications. Stay away from alcohol and other recreational drugs. Take Short Frequent Breaks at Work: Try to take frequent breaks from work and do not work continuously. Try and manage your work in such a way that you even meet your deadline and come home on time for a happy dinner with family. A good time spent with family and kids does wonders in not only dealing with Chronic Stress Syndrome but also preventing it.  Become Physically Active: Another step towards getting rid of Chronic Stress Syndrome is physical activity. If you do not have time to spend at the gym then at least try and go for daily walks for about half an hour a day which not only keeps the stress away but also is good for your overall health. Physical activity leads to production of endorphins which will make you feel relaxed and feel good.  Healthy Diet Can Help You Deal With Chronic Stress Syndrome: Have a balanced and healthy diet is another step towards a stress free life and keeping Chronic Stress Syndrome at  bay. If time is a constraint then you can try eating three small meals a day. Try and avoid fast foods and take foods which are healthy and rich in proteins, fiber, and carbohydrates to boost your energy system.  Music Can Soothe Your Mind: Light music is one of the best and most effective relaxation techniques that one can try to overcome stress. It has shown to calm down the mind and take you away from all the stressors that you may be having. These days it is also being used as a therapy in some institutes for overcoming stress. It is important here to discuss the importance of a good social support system for patients with Chronic Stress Syndrome, as a good social support framework can do wonders in taking the stress away from the patient and overcoming Chronic Stress Syndrome.  Meditation Can Help You Deal With Chronic Stress Syndrome Effectively: Meditation and yoga has also shown to be quite effective in relaxing the mind and coping up with Chronic Stress Syndrome   In cases where these measures are not helpful, then it is time for you to consult with a skilled psychologist or a psychiatrist for potential therapies or medications to control the stress response.   The psychologist can help you with a variety of steps for coping up with Chronic Stress Syndrome. Relaxation  techniques and behavioral therapy are some of the methods employed by psychologists. In some cases, medications can also be given to help relax the patient.  Since Chronic Stress Syndrome is both emotionally and physically draining for the patient and it also adversely affects the family life of the patient hence it is important for the patient to recognize the condition and taking steps to cope up with it. Escaping measures like alcohol and drug use are of no help as they only aggravate the condition apart from their other health hazards. If this condition is ignored or left untreated it can lead to various medical conditions like  anxiety and depression and various other medical conditions.  Last but not least, smile as often as you can as it is the best gift that you can give to someone. The best way to stay relaxed is to have a good smile, exercise daily, spend time with your family, meditation and if required consultation with a good psychologist so that you can live a stress free life and overcome the symptoms of Chronic Stress Syndrome.

## 2017-04-09 NOTE — Progress Notes (Signed)
Impression and Recommendations:    1. BMI 30.0-30.9,adult   2. Depression, unspecified depression type   3. Adjustment disorder with mixed anxiety and depressed mood   4. Vitamin D insufficiency     Goals for next office visit in 4 months: 1-  5 pound weight loss;  2-visit a counselor.  --> My goals for you: Be more consistent with medications, and be more consistent with treating yourself well.  Please take 5000 IUs of vitamin D3 daily we will recheck this in 6 months  Please consider transcendental meditation, getting more consistent with exercise to a goal of 30 minutes 5 days a week and consider counseling in addition to taking her medicines regularly.  The patient was counseled, risk factors were discussed, anticipatory guidance given.    Meds ordered this encounter  Medications  . DISCONTD: Cholecalciferol (VITAMIN D3 PO)    Sig: Take by mouth.  Marland Kitchen. buPROPion (WELLBUTRIN XL) 300 MG 24 hr tablet    Sig: Take 1 tablet (300 mg total) by mouth daily.    Dispense:  90 tablet    Refill:  1  . escitalopram (LEXAPRO) 10 MG tablet    Sig: Take 1 tablet (10 mg total) by mouth daily.    Dispense:  90 tablet    Refill:  1  . Cholecalciferol (VITAMIN D3) 5000 units CAPS    Sig: Take 1 capsule (5,000 Units total) by mouth daily.    Dispense:  30 capsule     Discontinued Medications   No medications on file      No orders of the defined types were placed in this encounter.    Gross side effects, risk and benefits, and alternatives of medications and treatment plan in general discussed with patient.  Patient is aware that all medications have potential side effects and we are unable to predict every side effect or drug-drug interaction that may occur.   Patient will call with any questions prior to using medication if they have concerns.  Expresses verbal understanding and consents to current therapy and treatment regimen.  No barriers to understanding were identified.   Red flag symptoms and signs discussed in detail.  Patient expressed understanding regarding what to do in case of emergency\urgent symptoms  Please see AVS handed out to patient at the end of our visit for further patient instructions/ counseling done pertaining to today's office visit.   Return in about 4 months (around 08/10/2017).     Note: This document was prepared using Dragon voice recognition software and may include unintentional dictation errors.  [name] 9:43 AM --------------------------------------------------------------------------------------------------------------------------------------------------------------------------------------------------------------------------------------------    Subjective:    CC:  Chief Complaint  Patient presents with  . Depression  . Anxiety    HPI: Stephanie Chan is a 51 y.o. female who presents [facility] today for issues as discussed below.   - Vitamin D3 supplements are helping with her mood, energy level and overall feelings of wellness.  Stress levels are about the same.  She is taking Wellbutrin and Lexapro most days when she remembers.  She is not consistent with her exercise and has not seen a Veterinary surgeoncounselor.    Weight: Is remaining stable.  Only 2 pound weight gain in the past 6-10 months.  Not currently working on it.  Wt Readings from Last 3 Encounters:  04/09/17 191 lb (86.6 kg)  10/05/16 192 lb 6.4 oz (87.3 kg)  08/08/16 189 lb 8 oz (  86 kg)   BP Readings from Last 3 Encounters:  04/09/17 99/63  10/05/16 112/72  08/08/16 106/73   Pulse Readings from Last 3 Encounters:  04/09/17 81  10/05/16 87  08/08/16 71   BMI Readings from Last 3 Encounters:  04/09/17 30.83 kg/m  10/05/16 31.05 kg/m  08/08/16 30.59 kg/m     Patient Care Team    Relationship Specialty Notifications Start End  Thomasene Lot, DO PCP - General Family Medicine  08/08/16      Patient  Active Problem List   Diagnosis Date Noted  . Counseling on health promotion and disease prevention 10/27/2016  . Vitamin D insufficiency 10/05/2016  . Perimenopausal hot flashes.  08/08/2016  . BMI 30.0-30.9,adult 08/08/2016  . Depression 08/08/2016  . Adjustment disorder with mixed anxiety and depressed mood 08/08/2016  . Environmental and seasonal allergies 08/08/2016  . History of migraines 08/08/2016    Past Medical history, Surgical history, Family history, Social history, Allergies and Medications have been entered into the medical record, reviewed and changed as needed.    Current Meds  Medication Sig  . acetaminophen (TYLENOL) 500 MG tablet Take 1,000 mg by mouth every 6 (six) hours as needed for moderate pain or headache. Pain  . ALPRAZolam (XANAX) 0.25 MG tablet Take 0.25 mg by mouth daily as needed. anxiety  . aspirin EC 81 MG tablet Take 81 mg by mouth daily.  Marland Kitchen buPROPion (WELLBUTRIN XL) 300 MG 24 hr tablet Take 1 tablet (300 mg total) by mouth daily.  . cetirizine-pseudoephedrine (ZYRTEC-D) 5-120 MG per tablet Take 1 tablet by mouth daily as needed for allergies.   . Cholecalciferol (VITAMIN D3) 5000 units CAPS Take 1 capsule (5,000 Units total) by mouth daily.  Marland Kitchen escitalopram (LEXAPRO) 10 MG tablet Take 1 tablet (10 mg total) by mouth daily.  . fexofenadine-pseudoephedrine (ALLEGRA-D) 60-120 MG 12 hr tablet Take 1 tablet by mouth 2 (two) times daily as needed (allergies).  Marland Kitchen ibuprofen (ADVIL,MOTRIN) 200 MG tablet Take 400 mg by mouth every 6 (six) hours as needed. Pain  . Multiple Vitamin (MULTIVITAMIN) tablet Take 1 tablet by mouth daily.  . [DISCONTINUED] buPROPion (WELLBUTRIN XL) 300 MG 24 hr tablet Take 1 tablet (300 mg total) by mouth daily.  . [DISCONTINUED] Cholecalciferol (VITAMIN D3 PO) Take by mouth.  . [DISCONTINUED] escitalopram (LEXAPRO) 10 MG tablet Take 1 tablet (10 mg total) by mouth daily.    Allergies:  No Known Allergies   Review of  Systems: General:   Denies fever, chills, unexplained weight loss.  Optho/Auditory:   Denies visual changes, blurred vision/LOV Respiratory:   Denies wheeze, DOE more than baseline levels.  Cardiovascular:   Denies chest pain, palpitations, new onset peripheral edema  Gastrointestinal:   Denies nausea, vomiting, diarrhea, abd pain.  Genitourinary: Denies dysuria, freq/ urgency, flank pain or discharge from genitals.  Endocrine:     Denies hot or cold intolerance, polyuria, polydipsia. Musculoskeletal:   Denies unexplained myalgias, joint swelling, unexplained arthralgias, gait problems.  Skin:  Denies new onset rash, suspicious lesions Neurological:     Denies dizziness, unexplained weakness, numbness  Psychiatric/Behavioral:   Denies mood changes, suicidal or homicidal ideations, hallucinations    Objective:   Blood pressure 99/63, pulse 81, temperature 98.2 F (36.8 C), temperature source Oral, height 5\' 6"  (1.676 m), weight 191 lb (86.6 kg), SpO2 99 %. Body mass index is 30.83 kg/m. General:  Well Developed, well nourished, appropriate for stated age.  Neuro:  Alert and oriented,  extra-ocular muscles  intact  HEENT:  Normocephalic, atraumatic, neck supple, no carotid bruits appreciated  Skin:  no gross rash, warm, pink. Cardiac:  RRR, S1 S2 Respiratory:  ECTA B/L and A/P, Not using accessory muscles, speaking in full sentences- unlabored. Vascular:  Ext warm, no cyanosis apprec.; cap RF less 2 sec. Psych:  No HI/SI, judgement and insight good, Euthymic mood. Full Affect.

## 2017-04-30 ENCOUNTER — Other Ambulatory Visit: Payer: Self-pay

## 2017-04-30 NOTE — Telephone Encounter (Signed)
Refill request from [facility] for xanax 0.25mg , takes once daily as needed.  No history of Dr Sharee Holsterpalski prescribing.  Patient last seen 04/09/2017.  Please advise refill.

## 2017-05-02 NOTE — Telephone Encounter (Signed)
According to the [place] [employer]rolled [employer] reporting site patient has not had xanax filled in the last year.

## 2017-05-02 NOTE — Telephone Encounter (Signed)
Please look [place] up [place] [facility] and make sure she's not getting controlled substances from any other providers.  Please let me know when she last obtained a prescription for it.   THNX

## 2017-05-03 MED ORDER — ALPRAZOLAM 0.25 MG PO TABS: 0.2500 mg | ORAL_TABLET | Freq: Every day | ORAL | 0 refills | Status: DC | PRN

## 2017-05-06 NOTE — Telephone Encounter (Signed)
Xanax called into pharmacy.

## 2017-08-09 ENCOUNTER — Encounter: Payer: Self-pay | Admitting: Family Medicine

## 2017-08-09 ENCOUNTER — Ambulatory Visit (INDEPENDENT_AMBULATORY_CARE_PROVIDER_SITE_OTHER): Payer: 59 | Admitting: Family Medicine

## 2017-08-09 VITALS — BP 105/72 | HR 70 | Ht 66.0 in | Wt 190.1 lb

## 2017-08-09 DIAGNOSIS — J3089 Other allergic rhinitis: Secondary | ICD-10-CM

## 2017-08-09 DIAGNOSIS — F32A Depression, unspecified: Secondary | ICD-10-CM

## 2017-08-09 DIAGNOSIS — F329 Major depressive disorder, single episode, unspecified: Secondary | ICD-10-CM

## 2017-08-09 DIAGNOSIS — E559 Vitamin D deficiency, unspecified: Secondary | ICD-10-CM

## 2017-08-09 DIAGNOSIS — F4323 Adjustment disorder with mixed anxiety and depressed mood: Secondary | ICD-10-CM

## 2017-08-09 MED ORDER — ALPRAZOLAM 0.25 MG PO TABS: 0.2500 mg | ORAL_TABLET | Freq: Every day | ORAL | 0 refills | Status: DC | PRN

## 2017-08-09 MED ORDER — ESCITALOPRAM OXALATE 10 MG PO TABS: 10.0000 mg | ORAL_TABLET | Freq: Every day | ORAL | 1 refills | Status: DC

## 2017-08-09 MED ORDER — BUPROPION HCL ER (XL) 300 MG PO TB24: 300.0000 mg | ORAL_TABLET | Freq: Every day | ORAL | 1 refills | Status: DC

## 2017-08-09 NOTE — Patient Instructions (Signed)
Factors that increase risk for colorectal cancer:  - Family history of colorectal cancer - Having colorectal cancer in a family member increases your risk of cancer if the family member is a first-degree relative (a parent, brother or sister, or child), if several family members are affected, or if the cancers occurred at an early age (eg, before age 50 years).  - Prior colorectal cancer or polyps - People who have previously had colorectal cancer have an increased risk of developing a new colorectal cancer. People who have had adenomatous polyps before the age of 50 years are also at increased risk for developing colorectal cancer.   - Increasing age - Although the average person has a 4.5 percent lifetime risk of developing colorectal cancer, 41 percent of these cancers occur in people older than 50 years of age. Risk increases with age throughout life.  - Lifestyle factors - Several lifestyle factors increase the risk of colorectal cancer, including: . A diet high in fat and red or processed meat and low in fiber . A sedentary lifestyle . Cigarette smoking . Alcohol use . Obesity   Large increase in risk - Some conditions greatly increase the risk of colorectal cancer. Such as: Familial adenomatous polyposis - Familial adenomatous polyposis (FAP) is an uncommon inherited condition. Nearly 100 percent of people with this condition will develop colorectal cancer during their lifetime, and most of these cancers occur before the age of 84 years. FAP causes hundreds of polyps to develop throughout the colon beginning in adolescence.   Hereditary nonpolyposis colon cancer - Hereditary nonpolyposis colon cancer (HNPCC, also called Lynch syndrome) is another inherited condition associated with an increased risk of colorectal cancer. It is slightly more common than FAP, but is still uncommon, accounting for less than 1 in 20 cases of colorectal cancer.  About 47 percent of people with HNPCC will  experience colorectal cancer by the age of 69. Cancer also tends to occur at younger ages. People with HNPCC are also at risk for other types of cancer, including cancer of the uterus, stomach, bladder, kidney, and ovary.   There are other rarer inherited conditions that increase risk of colorectal cancer, including MUTYH-associated polyposis, hamartomatous polyposis, Peutz-Jeghers syndrome, and juvenile polyposis syndrome.  Inflammatory bowel disease - People with Crohn disease of the colon or ulcerative colitis have an increased risk of colorectal cancer. The amount of increased risk depends upon the amount of inflamed colon and the duration of disease; pancolitis (inflammation of the entire colon) and colitis of 10 years' duration or longer are associated with the greatest risk for colorectal cancer. The risk of colon cancer is not increased in people with irritable bowel disease.  Factors that may decrease risk - Aspirin, ibuprofen, and related nonsteroidal antiinflammatory medications may decrease the risk of developing colorectal cancer.     COLON CANCER SCREENING TESTS  Several tests available for colorectal cancer screening can detect precancerous polyps (adenomas) and can lead to cancer prevention and/or detect cancers at an early, more treatable stage.  Guidelines from expert groups recommend that you and your health care provider discuss the available options and choose a testing strategy that makes sense for you. Some experts believe that tests that detect precancerous polyps are preferable; these include colonoscopy, computed tomography colonography (CTC), flexible sigmoidoscopy, and the newer stool DNA test [1]. Other experts believe that being screened with any test, including stool tests that detect blood, is more important than which test is used.  Colonoscopy - Colonoscopy allows  a clinician to see the lining of the rectum and the entire colon   Procedure - Colonoscopy requires  that you prepare by cleaning out your entire colon and rectum. This usually involves consuming a liquid medication that causes temporary diarrhea. You are usually given a mild sedative drug before the procedure. During colonoscopy, a thin, lighted tube is used to directly view the lining of the rectum and the entire colon. Biopsies (small samples of tissue) can be taken during the procedure. Polyps and some cancers can be removed during this procedure.  Effectiveness - Colonoscopy detects most small polyps and almost all large polyps and cancers and substantially reduces the risk of developing and dying from colorectal cancer [2].  Risks and disadvantages - The risks of colonoscopy, while small, are greater than those of other screening tests. Colonoscopy may lead to serious bleeding or a tear of the intestinal wall in some individuals (about 1 in 1,000). Because the procedure usually requires sedation, you must be accompanied home after the procedure and you should not return to work or other activities on the same day.   Sigmoidoscopy - Sigmoidoscopy allows a clinician to directly view the lining of the rectum and the lower part of the colon (the descending colon). This area accounts for about one-half of the total area of the rectum and colon.   Procedure - Sigmoidoscopy requires that you prepare by cleaning out the lower bowel. This usually involves consuming a clear liquid diet and using an enema shortly before the examination. Most people do not need sedative drugs and are able to return to work or other activities the same day. During the procedure, a thin, lighted tube is advanced into the rectum and through the left side of the colon to check for polyps and cancer; the procedure may cause mild cramping. Biopsies (small samples of tissue) can be taken during sigmoidoscopy. Sigmoidoscopy may be performed in a doctor's office.  Effectiveness - Sigmoidoscopy can identify polyps and cancers in the  descending colon and rectum with a high degree of accuracy. Studies have shown that sigmoidoscopy reduces the incidence of colorectal cancer and overall mortality.   Risks and disadvantages - The risks of sigmoidoscopy are small. The procedure could create a small tear in the intestinal wall in about 2 per every 10,000 people; death from this complication is rare. A major disadvantage of sigmoidoscopy is that it cannot detect polyps or cancers that are located in the right side (eg, the cecum, ascending colon hepatic flexure, or some of the transverse colon), which are more common in older women.  Additional testing - Having polyps or cancers in the lower colon increases the likelihood that there are polyps or cancer in the remaining part of the colon. Thus, if sigmoidoscopy reveals polyps or cancer, colonoscopy is recommended to view the entire length of the colon.   CT colonography ("virtual colonoscopy") - Computed tomography colonography (CTC, sometimes called "virtual colonoscopy") is a test that uses a CT scanner to take images of the entire bowel. These images are in two- and three-dimensions and are reconstructed to allow a radiologist to determine if polyps or cancers are present (picture   1). The major advantages of CTC are that it does not require sedation, it is noninvasive, the entire bowel can be examined, and abnormal areas (adenomas) can be detected about as well as with traditional (optical) colonoscopy.  2) There are several disadvantages of CTC. Like traditional colonoscopy, CTC usually requires a bowel prep to clean  out the colon. If an abnormal area is found with CTC, a traditional colonoscopy will be needed to see the area and take a tissue sample (biopsy). CTC may detect abnormalities other than polyps or cancer in the colon/rectum. Many of these incidental findings will require further testing that could lead to harm. CTC may not be covered by health insurance plans in the [place]. CTC, like many other imaging tests, exposes patients to radiation which may have long-term risks.   Stool tests - Colorectal cancers often release microscopic amounts of blood and abnormal DNA into the stool. Stool tests can detect blood or abnormal DNA markers.   Two types of tests, called guaiac tests (Hemoccult SENSA) and immunochemical tests, evaluate the stool for blood, which may be present if there is bleeding from a colon cancer (or other source of blood).   With guaiac testing, you collect two samples of stool from three consecutive bowel movements, which you apply to home collection cards. You mail the cards back to the health care provider. You should avoid drugs that irritate the stomach, such as aspirin and nonsteroidal antiinflammatory drugs (NSAIDs), before collecting the stool.  With immunochemical testing, you use a long-handled tool to collect the specimen according to the manufacturer's instructions. You apply the brush to a card, and then mail the card to a laboratory. You do not have to change your diet or stop any medications with this test. Immunochemical testing is more convenient and somewhat better able to find cancer than guaiac testing, but the test kit is a bit more expensive.   Stool testing for blood, when performed once per year, reduces the risk of dying from colorectal cancer by one-third or more [4]. However, because polyps seldom bleed, stool testing for blood is less likely to detect polyps than other screening tests. In addition, only 2 to 5 percent of people with a positive stool test actually have colorectal cancer. If the stool test is positive, your entire colon should be examined with colonoscopy.   A DNA test ( Cologuard test)  is yet another option and is done every three years. This test looks for specific DNA markers that may signify the presence of a colon cancer, and it also looks for blood in the stool. An entire bowel movement needs to be  collected and shipped to the laboratory for testing. An abnormal test should be followed up by colonoscopy.  Fecal occult blood test and sigmoidoscopy - Combined screening with a fecal immunochemical test and sigmoidoscopy is a possible screening strategy and may be more effective than either test done alone. The sensitive fecal occult blood test can be used in place of the immunochemical test if necessary.    COLON CANCER SCREENING PLANS  The colon cancer screening plan that is right for you depends upon your risk of colorectal cancer.  Average risk of colorectal cancer - People with an average risk of colorectal cancer should begin screening at age 70. Any one of the following screening strategies is recommended:  - Colonoscopy every 10 years - Computed tomographic colonography (CTC) every five years - Flexible sigmoidoscopy every five years, with or without an immunochemical stool test - Stool testing every year (for guaiac and immunochemical occult blood tests) - Stool testing every three years using a DNA assay and a collection of a full bowel movement  - Increased risk of colorectal cancer - Screening plans for people with an increased risk may entail screening at a younger age, more  frequent screening, and/or the use of more sensitive screening tests (usually colonoscopy). The optimal screening plan depends upon the reason for increased risk.  Family history of colorectal cancer  - People who have one first-degree relative (parent, brother, sister, or child) with colorectal cancer or adenomatous polyps at a young age (before the age of 54 years), or two first-degree relatives diagnosed at any age, should begin screening for colon cancer earlier, typically at age 63, or 62 years younger than the earliest diagnosis in their family, whichever comes first. Screening usually involves colonoscopy every five years.  - People who have one first-degree relative (parent, brother, sister, or  child) who has experienced colorectal cancer or adenomatous polyps at age 59 or later, or two or more second-degree relatives (grandparent, aunt, uncle) with colorectal cancer should begin screening by colonoscopy earlier at age 22, but screening should be repeated as for average-risk people.  - People with a second-degree relative (grandparent, aunt, or uncle) or third-degree relative (great-grandparent or cousin) with colorectal cancer are considered to have an average risk of colorectal cancer (see 'Average risk of colorectal cancer' above).  -Some people have known genetically-based colon cancer syndromes in their family, such as familial adenomatous polyposis (FAP) or hereditary nonpolyposis colon cancer (HNPCC). These less common conditions require aggressive screening and preventive treatments, and individuals with these conditions in their family should be managed by a clinician with clinical expertise in these syndromes.  - Inflammatory bowel disease - People with ulcerative colitis or Crohn disease have an increased risk of colon cancer. The best screening plan depends upon how much of the colon is affected and how long you have had the disease.

## 2017-08-09 NOTE — Progress Notes (Signed)
Impression and Recommendations:    1. Adjustment disorder with mixed anxiety and depressed mood   2. Depression, unspecified depression type   3. Environmental and seasonal allergies   4. Vitamin D insufficiency     Adjustment disorder with mixed anxiety and depressed mood - Plan: buPROPion (WELLBUTRIN XL) 300 MG 24 hr tablet, escitalopram (LEXAPRO) 10 MG tablet  Depression, unspecified depression type - Plan: buPROPion (WELLBUTRIN XL) 300 MG 24 hr tablet, escitalopram (LEXAPRO) 10 MG tablet  Environmental and seasonal allergies  Vitamin D insufficiency  No problem-specific Assessment & Plan notes found for this encounter.   The patient was counseled, risk factors were discussed, anticipatory guidance given.   New Prescriptions   No medications on file    Discontinued Medications   No medications on file    Modified Medications   Modified Medication Previous Medication   ALPRAZOLAM (XANAX) 0.25 MG TABLET ALPRAZolam (XANAX) 0.25 MG tablet      Take 1 tablet (0.25 mg total) by mouth daily as needed for anxiety. anxiety    Take 1 tablet (0.25 mg total) by mouth daily as needed. anxiety   BUPROPION (WELLBUTRIN XL) 300 MG 24 HR TABLET buPROPion (WELLBUTRIN XL) 300 MG 24 hr tablet      Take 1 tablet (300 mg total) by mouth daily.    Take 1 tablet (300 mg total) by mouth daily.   ESCITALOPRAM (LEXAPRO) 10 MG TABLET escitalopram (LEXAPRO) 10 MG tablet      Take 1 tablet (10 mg total) by mouth daily.    Take 1 tablet (10 mg total) by mouth daily.     Meds ordered this encounter  Medications  . buPROPion (WELLBUTRIN XL) 300 MG 24 hr tablet    Sig: Take 1 tablet (300 mg total) by mouth daily.    Dispense:  90 tablet    Refill:  1  . escitalopram (LEXAPRO) 10 MG tablet    Sig: Take 1 tablet (10 mg total) by mouth daily.    Dispense:  90 tablet    Refill:  1  . ALPRAZolam (XANAX) 0.25 MG tablet    Sig: Take 1 tablet (0.25 mg total) by mouth daily as needed for  anxiety. anxiety    Dispense:  30 tablet    Refill:  0     No orders of the defined types were placed in this encounter.    Gross side effects, risk and benefits, and alternatives of medications and treatment plan in general discussed with patient.  Patient is aware that all medications have potential side effects and we are unable to predict every side effect or drug-drug interaction that may occur.   Patient will call with any questions prior to using medication if they have concerns.  Expresses verbal understanding and consents to current therapy and treatment regimen.  No barriers to understanding were identified.  Red flag symptoms and signs discussed in detail.  Patient expressed understanding regarding what to do in case of emergency\urgent symptoms  Please see AVS handed out to patient at the end of our visit for further patient instructions/ counseling done pertaining to today's office visit.   No Follow-up on file.     Note: This document was prepared using Dragon voice recognition software and may include unintentional dictation errors.  Edmonia Gonser 10:08 AM --------------------------------------------------------------------------------------------------------------------------------------------------------------------------------------------------------------------------------------------    Subjective:    CC:  Chief Complaint  Patient presents with  . Follow-up   ---> Patient was not ready for me  to even begin seeing her until 950 am  HPI: Stephanie Chan is a 51 y.o. female who presents to [facility] today for issues as discussed below.  Patient last seen 5\22\18 where we spent a lot of time discussing her stress levels and depressive feelings about her weight etc.  -Goals last office visit were:   Goals for next office visit in 4 months: 1-  5 pound weight loss;  2-visit a counselor.  --> My goals for you: Be more  consistent with medications, and be more consistent with treating yourself well.  Please take 5000 IUs of vitamin D3 daily we will recheck this in 6 months  Please consider transcendental meditation, getting more consistent with exercise to a goal of 30 minutes 5 days a week and consider counseling in addition to taking her medicines regularly.  Today:  Tried meditation; has been consitent with meds,  No recetn exercise as daughter is getting married in she's been extremely busy.,  Also she has lost 1 pound since last seen.  She did not obtain a Veterinary surgeon.  Taking vit D--> not as tired.     Exercise prior-- 2 wks ago- with dogs 3d/wk.   Recently moved- more stress.  Takes zanax 2-3 times per week,      Wedding OCt 6th-->     GAD 7 : Generalized Anxiety Score 08/09/2017 04/09/2017  Nervous, Anxious, on Edge 1 1  Control/stop worrying 0 1  Worry too much - different things 1 1  Trouble relaxing 1 1  Restless 1 1  Easily annoyed or irritable 0 1  Afraid - awful might happen 0 0  Total GAD 7 Score 4 6  Anxiety Difficulty Somewhat difficult Somewhat difficult   Depression screen [facility] 2/9 08/09/2017 04/09/2017  Decreased Interest 0 1  Down, Depressed, Hopeless 1 1  PHQ - 2 Score 1 2  Altered sleeping 1 2  Tired, decreased energy 1 0  Change in appetite 0 2  Feeling bad or failure about yourself  1 2  Trouble concentrating 1 0  Moving slowly or fidgety/restless 1 1  Suicidal thoughts 0 0  PHQ-9 Score 6 9  Difficult doing work/chores Somewhat difficult -    No problems updated.   Wt Readings from Last 3 Encounters:  08/09/17 190 lb 1.6 oz (86.2 kg)  04/09/17 191 lb (86.6 kg)  10/05/16 192 lb 6.4 oz (87.3 kg)   BP Readings from Last 3 Encounters:  08/09/17 105/72  04/09/17 99/63  10/05/16 112/72   Pulse Readings from Last 3 Encounters:  08/09/17 70  04/09/17 81  10/05/16 87   BMI Readings from Last 3 Encounters:  08/09/17 30.68 kg/m  04/09/17 30.83 kg/m    10/05/16 31.05 kg/m     Patient Care Team    Relationship Specialty Notifications Start End  Thomasene Lot, DO PCP - General Family Medicine  08/08/16      Patient Active Problem List   Diagnosis Date Noted  . Counseling on health promotion and disease prevention 10/27/2016  . Vitamin D insufficiency 10/05/2016  . Perimenopausal hot flashes.  08/08/2016  . BMI 30.0-30.9,adult 08/08/2016  . Depression 08/08/2016  . Adjustment disorder with mixed anxiety and depressed mood 08/08/2016  . Environmental and seasonal allergies 08/08/2016  . History of migraines 08/08/2016    Past Medical history, Surgical history, Family history, Social history, Allergies and Medications have been entered into the medical record, reviewed and changed as needed.  Current Meds  Medication Sig  . acetaminophen (TYLENOL) 500 MG tablet Take 1,000 mg by mouth every 6 (six) hours as needed for moderate pain or headache. Pain  . ALPRAZolam (XANAX) 0.25 MG tablet Take 1 tablet (0.25 mg total) by mouth daily as needed for anxiety. anxiety  . aspirin EC 81 MG tablet Take 81 mg by mouth daily.  Marland Kitchen buPROPion (WELLBUTRIN XL) 300 MG 24 hr tablet Take 1 tablet (300 mg total) by mouth daily.  . Cholecalciferol (VITAMIN D3) 5000 units CAPS Take 1 capsule (5,000 Units total) by mouth daily.  Marland Kitchen escitalopram (LEXAPRO) 10 MG tablet Take 1 tablet (10 mg total) by mouth daily.  Marland Kitchen ibuprofen (ADVIL,MOTRIN) 200 MG tablet Take 400 mg by mouth every 6 (six) hours as needed. Pain  . Multiple Vitamin (MULTIVITAMIN) tablet Take 1 tablet by mouth daily.  . [DISCONTINUED] ALPRAZolam (XANAX) 0.25 MG tablet Take 1 tablet (0.25 mg total) by mouth daily as needed. anxiety  . [DISCONTINUED] buPROPion (WELLBUTRIN XL) 300 MG 24 hr tablet Take 1 tablet (300 mg total) by mouth daily.  . [DISCONTINUED] escitalopram (LEXAPRO) 10 MG tablet Take 1 tablet (10 mg total) by mouth daily.    Allergies:  No Known Allergies   Review of  Systems: General:   Denies fever, chills, unexplained weight loss.  Optho/Auditory:   Denies visual changes, blurred vision/LOV Respiratory:   Denies wheeze, DOE more than baseline levels.  Cardiovascular:   Denies chest pain, palpitations, new onset peripheral edema  Gastrointestinal:   Denies nausea, vomiting, diarrhea, abd pain.  Genitourinary: Denies dysuria, freq/ urgency, flank pain or discharge from genitals.  Endocrine:     Denies hot or cold intolerance, polyuria, polydipsia. Musculoskeletal:   Denies unexplained myalgias, joint swelling, unexplained arthralgias, gait problems.  Skin:  Denies new onset rash, suspicious lesions Neurological:     Denies dizziness, unexplained weakness, numbness  Psychiatric/Behavioral:   Denies mood changes, suicidal or homicidal ideations, hallucinations    Objective:   Blood pressure 105/72, pulse 70, height  (1.676 m), weight 190 lb 1.6 oz (86.2 kg). Body mass index is 30.68 kg/m. General:  Well Developed, well nourished, appropriate for stated age.  Neuro:  Alert and oriented,  extra-ocular muscles intact  HEENT:  Normocephalic, atraumatic, neck supple, no carotid bruits appreciated  Skin:  no gross rash, warm, pink. Cardiac:  RRR, S1 S2 Respiratory:  ECTA B/L and A/P, Not using accessory muscles, speaking in full sentences- unlabored. Vascular:  Ext warm, no cyanosis apprec.; cap RF less 2 sec. Psych:  No HI/SI, judgement and insight good, Euthymic mood. Full Affect.

## 2017-10-04 ENCOUNTER — Encounter: Payer: 59 | Admitting: Family Medicine

## 2017-10-18 ENCOUNTER — Other Ambulatory Visit: Payer: 59

## 2017-10-18 DIAGNOSIS — Z0000 Encounter for general adult medical examination without abnormal findings: Secondary | ICD-10-CM

## 2017-10-19 LAB — COMPREHENSIVE METABOLIC PANEL
A/G RATIO: 2.1 (ref 1.2–2.2)
ALBUMIN: 4.6 g/dL (ref 3.5–5.5)
ALK PHOS: 91 IU/L (ref 39–117)
ALT: 14 IU/L (ref 0–32)
AST: 13 IU/L (ref 0–40)
BILIRUBIN TOTAL: 0.4 mg/dL (ref 0.0–1.2)
BUN/Creatinine Ratio: 31 — ABNORMAL HIGH (ref 9–23)
BUN: 21 mg/dL (ref 6–24)
CHLORIDE: 107 mmol/L — AB (ref 96–106)
CO2: 24 mmol/L (ref 20–29)
Calcium: 8.9 mg/dL (ref 8.7–10.2)
Creatinine, Ser: 0.67 mg/dL (ref 0.57–1.00)
GFR calc Af Amer: 119 mL/min/{1.73_m2} (ref >59)
GFR calc non Af Amer: 103 mL/min/{1.73_m2} (ref >59)
GLOBULIN, TOTAL: 2.2 g/dL (ref 1.5–4.5)
Glucose: 88 mg/dL (ref 65–99)
POTASSIUM: 4.4 mmol/L (ref 3.5–5.2)
Sodium: 144 mmol/L (ref 134–144)
TOTAL PROTEIN: 6.8 g/dL (ref 6.0–8.5)

## 2017-10-19 LAB — CBC WITH DIFFERENTIAL/PLATELET
BASOS: 1 %
Basophils Absolute: 0 10*3/uL (ref 0.0–0.2)
EOS (ABSOLUTE): 0.1 10*3/uL (ref 0.0–0.4)
Eos: 2 %
HEMATOCRIT: 36.9 % (ref 34.0–46.6)
HEMOGLOBIN: 12.9 g/dL (ref 11.1–15.9)
IMMATURE GRANS (ABS): 0 10*3/uL (ref 0.0–0.1)
Immature Granulocytes: 0 %
LYMPHS ABS: 1.4 10*3/uL (ref 0.7–3.1)
LYMPHS: 36 %
MCH: 30.1 pg (ref 26.6–33.0)
MCHC: 35 g/dL (ref 31.5–35.7)
MCV: 86 fL (ref 79–97)
MONOCYTES: 7 %
Monocytes Absolute: 0.3 10*3/uL (ref 0.1–0.9)
NEUTROS ABS: 2.1 10*3/uL (ref 1.4–7.0)
Neutrophils: 54 %
Platelets: 178 10*3/uL (ref 150–379)
RBC: 4.29 x10E6/uL (ref 3.77–5.28)
RDW: 13.6 % (ref 12.3–15.4)
WBC: 3.8 10*3/uL (ref 3.4–10.8)

## 2017-10-19 LAB — LIPID PANEL
CHOL/HDL RATIO: 3.5 ratio (ref 0.0–4.4)
Cholesterol, Total: 190 mg/dL (ref 100–199)
HDL: 55 mg/dL (ref >39)
LDL Calculated: 121 mg/dL — ABNORMAL HIGH (ref 0–99)
Triglycerides: 69 mg/dL (ref 0–149)
VLDL Cholesterol Cal: 14 mg/dL (ref 5–40)

## 2017-10-19 LAB — VITAMIN D 25 HYDROXY (VIT D DEFICIENCY, FRACTURES): VIT D 25 HYDROXY: 36.2 ng/mL (ref 30.0–100.0)

## 2017-10-19 LAB — HEMOGLOBIN A1C
ESTIMATED AVERAGE GLUCOSE: 100 mg/dL
HEMOGLOBIN A1C: 5.1 % (ref 4.8–5.6)

## 2017-10-19 LAB — TSH: TSH: 0.64 u[IU]/mL (ref 0.450–4.500)

## 2017-10-22 ENCOUNTER — Encounter: Payer: Self-pay | Admitting: Gastroenterology

## 2017-10-22 ENCOUNTER — Ambulatory Visit (INDEPENDENT_AMBULATORY_CARE_PROVIDER_SITE_OTHER): Payer: 59 | Admitting: Family Medicine

## 2017-10-22 ENCOUNTER — Encounter: Payer: Self-pay | Admitting: Family Medicine

## 2017-10-22 VITALS — BP 121/77 | HR 75 | Temp 98.4°F | Ht 66.0 in | Wt 194.8 lb

## 2017-10-22 DIAGNOSIS — Z0000 Encounter for general adult medical examination without abnormal findings: Secondary | ICD-10-CM | POA: Insufficient documentation

## 2017-10-22 DIAGNOSIS — Z7189 Other specified counseling: Secondary | ICD-10-CM

## 2017-10-22 DIAGNOSIS — Z23 Encounter for immunization: Secondary | ICD-10-CM | POA: Diagnosis not present

## 2017-10-22 DIAGNOSIS — Z6830 Body mass index (BMI) 30.0-30.9, adult: Secondary | ICD-10-CM | POA: Diagnosis not present

## 2017-10-22 DIAGNOSIS — Z1211 Encounter for screening for malignant neoplasm of colon: Secondary | ICD-10-CM | POA: Diagnosis not present

## 2017-10-22 DIAGNOSIS — Z1231 Encounter for screening mammogram for malignant neoplasm of breast: Secondary | ICD-10-CM

## 2017-10-22 DIAGNOSIS — Z1239 Encounter for other screening for malignant neoplasm of breast: Secondary | ICD-10-CM

## 2017-10-22 NOTE — Progress Notes (Signed)
Impression and Recommendations:    1. Encounter for wellness examination   2. Counseling on health promotion and disease prevention   3. BMI 30.0-30.9,adult   4. Screening for colon cancer   5. Screening for breast cancer    -  Asked her to be more consistent with her vitamin D at 5000 daily.  She is taking it only occasionally.  Also did improve in triglycerides and LDL.  She is less active as we did see a slight decrease in HDL.  We discussed also that her BUN was elevated and she needs to drink more water.  She is not even hitting probably half of her weight in ounces water per day.  Please see orders section below for further details of actions taken during this office visit.  Gross side effects, risk and benefits, and alternatives of medications discussed with patient.  Patient is aware that all medications have potential side effects and we are unable to predict every side effect or drug-drug interaction that may occur.  Expresses verbal understanding and consents to current therapy plan and treatment regiment.  1) Anticipatory Guidance: Discussed importance of wearing a seatbelt while driving, not texting while driving; sunscreen when outside along with yearly skin surveillance; eating a well balanced and modest diet; physical activity at least 25 minutes per day or 150 min/ week of moderate to intense activity.  2) Immunizations / Screenings / Labs:  All immunizations and screenings that patient agrees to, are up-to-date per recommendations or will be updated today.  Patient understands the needs for q 25mo dental and yearly vision screens which pt will schedule independently.   3) Weight:   Discussed goal of losing even 5-10% of current body weight which would improve overall feelings of well being and improve objective health data significantly.   Improve nutrient density of diet through increasing intake of fruits and vegetables and decreasing saturated/trans fats, white flour  products and refined sugar products.   F-up preventative CPE in 1 year. F/up sooner for chronic care management as discussed and/or prn.  Orders Placed This Encounter  Procedures  . MM Digital Screening  . Ambulatory referral to Gastroenterology    Please see orders placed and AVS handed out to patient at the end of our visit for further patient instructions/ counseling done pertaining to today's office visit.     Subjective:    Chief Complaint  Patient presents with  . Annual Exam    HPI: Stephanie Chan is a 51 y.o. female who presents to [facility] today a yearly health maintenance exam.  Health Maintenance Summary Reviewed and updated, unless pt declines services.  -I briefly reviewed lab results which were obtained 4 days ago.  They were all essentially within normal limits.  Asked her to be more consistent with her vitamin D at 5000 daily.  She is taking it only occasionally.  Also did improve in triglycerides and LDL.  She is less active as we did see a slight decrease in HDL.  We discussed also that her BUN was elevated and she needs to drink more water.  She is not even hitting probably half of her weight in ounces water per day.  Aspirin: administering 81 mg daily Colonoscopy:     None prior Tobacco History Reviewed:   Y  Alcohol:    No concerns, no excessive use  Exercise Habits:   Not meeting AHA guidelines STD concerns:   None-married and monogamous.  Drug Use:   None Birth control method:   N/a- postmenopausal for 5+ years. Menses regular:     n/a Lumps or breast concerns:      No;  Mammo- never had one in the past.  We will send her for one today.  She has no concerns.  She does breast exams once weekly.  No lumps or breast concerns. Breast Cancer Family History:      No Bone/ DEXA scan:   N/A  Health Maintenance  Topic Date Due  . INFLUENZA VACCINE  06/19/2017  . HIV Screening  08/09/2018 (Originally 09/21/1981)  . MAMMOGRAM   10/22/2018 (Originally 09/21/2017)  . COLONOSCOPY  10/22/2018 (Originally 09/21/2017)  . PAP SMEAR  04/06/2019  . TETANUS/TDAP  12/26/2022     Wt Readings from Last 3 Encounters:  10/22/17 194 lb 12.8 oz (88.4 kg)  08/09/17 190 lb 1.6 oz (86.2 kg)  04/09/17 191 lb (86.6 kg)   BP Readings from Last 3 Encounters:  10/22/17 121/77  08/09/17 105/72  04/09/17 99/63   Pulse Readings from Last 3 Encounters:  10/22/17 75  08/09/17 70  04/09/17 81     Past Medical History:  Diagnosis Date  . Bundle branch block   . Depression   . Migraines   . Panic attack   . Vertigo       Past Surgical History:  Procedure Laterality Date  . CESAREAN SECTION        Family History  Problem Relation Age of Onset  . Thyroid disease Mother 6940  . Kidney disease Father 8676  . Heart disease Father 5576  . Healthy Sister   . Healthy Brother   . Healthy Daughter   . Healthy Sister   . Healthy Sister   . Healthy Daughter   . Healthy Daughter       Social History   Substance and Sexual Activity  Drug Use No  ,   Social History   Substance and Sexual Activity  Alcohol Use Yes  . Alcohol/week: 1.2 oz  . Types: 2 Glasses of wine per week   Comment: socially  ,   Social History   Tobacco Use  Smoking Status Never Smoker  Smokeless Tobacco Never Used  ,   Social History   Substance and Sexual Activity  Sexual Activity Yes  . Birth control/protection: Surgical    Current Outpatient Medications on File Prior to Visit  Medication Sig Dispense Refill  . acetaminophen (TYLENOL) 500 MG tablet Take 1,000 mg by mouth every 6 (six) hours as needed for moderate pain or headache. Pain    . ALPRAZolam (XANAX) 0.25 MG tablet Take 1 tablet (0.25 mg total) by mouth daily as needed for anxiety. anxiety 30 tablet 0  . aspirin EC 81 MG tablet Take 81 mg by mouth daily.    Marland Kitchen. buPROPion (WELLBUTRIN XL) 300 MG 24 hr tablet Take 1 tablet (300 mg total) by mouth daily. 90 tablet 1  .  cetirizine-pseudoephedrine (ZYRTEC-D) 5-120 MG per tablet Take 1 tablet by mouth daily as needed for allergies.     . Cholecalciferol (VITAMIN D3) 5000 units CAPS Take 1 capsule (5,000 Units total) by mouth daily. 30 capsule   . escitalopram (LEXAPRO) 10 MG tablet Take 1 tablet (10 mg total) by mouth daily. 90 tablet 1  . fexofenadine-pseudoephedrine (ALLEGRA-D) 60-120 MG 12 hr tablet Take 1 tablet by mouth 2 (two) times daily as needed (allergies).    Marland Kitchen. ibuprofen (ADVIL,MOTRIN) 200 MG tablet Take 400  mg by mouth every 6 (six) hours as needed. Pain    . Multiple Vitamin (MULTIVITAMIN) tablet Take 1 tablet by mouth daily.     No current facility-administered medications on file prior to visit.     Allergies: Patient has no known allergies.  Review of Systems: General:   Denies fever, chills, unexplained weight loss.  Optho/Auditory:   Denies visual changes, blurred vision/LOV Respiratory:   Denies SOB, DOE more than baseline levels.  Cardiovascular:   Denies chest pain, palpitations, new onset peripheral edema  Gastrointestinal:   Denies nausea, vomiting, diarrhea.  Genitourinary: Denies dysuria, freq/ urgency, flank pain or discharge from genitals.  Endocrine:     Denies hot or cold intolerance, polyuria, polydipsia. Musculoskeletal:   Denies unexplained myalgias, joint swelling, unexplained arthralgias, gait problems.  Skin:  Denies rash, suspicious lesions Neurological:     Denies dizziness, unexplained weakness, numbness  Psychiatric/Behavioral:   Denies mood changes, suicidal or homicidal ideations, hallucinations    Objective:    Blood pressure 121/77, pulse 75, temperature 98.4 F (36.9 C), height 5\' 6"  (1.676 m), weight 194 lb 12.8 oz (88.4 kg). Body mass index is 31.44 kg/m. General Appearance:    Alert, cooperative, no distress, appears stated age  Head:    Normocephalic, without obvious abnormality, atraumatic  Eyes:    PERRL, conjunctiva/corneas clear, EOM's intact,  fundi    benign, both eyes  Ears:    Normal TM's and external ear canals, both ears  Nose:   Nares normal, septum midline, mucosa normal, no drainage    or sinus tenderness  Throat:   Lips w/o lesion, mucosa moist, and tongue normal; teeth and   gums normal  Neck:   Supple, symmetrical, trachea midline, no adenopathy;    thyroid:  no enlargement/tenderness/nodules; no carotid   bruit or JVD  Back:     Symmetric, no curvature, ROM normal, no CVA tenderness  Lungs:     Clear to auscultation bilaterally, respirations unlabored, no       Wh/ R/ R  Chest Wall:    No tenderness or gross deformity; normal excursion   Heart:    Regular rate and rhythm, S1 and S2 normal, no murmur, rub   or gallop  Breast Exam:    No tenderness, masses, or nipple abnormality b/l; no d/c  Abdomen:     Soft, non-tender, bowel sounds active all four quadrants, NO   G/R/R, no masses, no organomegaly  Genitalia:    deferred   Rectal:    deferred  Extremities:   Extremities normal, atraumatic, no cyanosis or gross edema  Pulses:   2+ and symmetric all extremities  Skin:   Warm, dry, Skin color, texture, turgor normal, no obvious rashes or lesions Psych: No HI/SI, judgement and insight good, Euthymic mood. Full Affect.  Neurologic:   CNII-XII intact, normal strength, sensation and reflexes    Throughout

## 2017-10-22 NOTE — Patient Instructions (Signed)
We are sending you for mammogram.  They will call you to schedule this.  If they offer you 3D mammogram, please do the 3D one which is more specific/ sensitive.  -Also we are sending you for colonoscopy.  If you have not heard about this in 1 week or so, please let us know.    Preventive Care for Adults, Female A healthy lifestyle and preventive care can promote health and wellness. Preventive health guidelines for women include the following key practices.   A routine yearly physical is a good way to check with your health care provider about your health and preventive screening. It is a chance to share any concerns and updates on your health and to receive a thorough exam.   Visit your dentist for a routine exam and preventive care every 6 months. Brush your teeth twice a day and floss once a day. Good oral hygiene prevents tooth decay and gum disease.   The frequency of eye exams is based on your age, health, family medical history, use of contact lenses, and other factors. Follow your health care provider's recommendations for frequency of eye exams.   Eat a healthy diet. Foods like vegetables, fruits, whole grains, low-fat dairy products, and lean protein foods contain the nutrients you need without too many calories. Decrease your intake of foods high in solid fats, added sugars, and salt. Eat the right amount of calories for you.Get information about a proper diet from your health care provider, if necessary.   Regular physical exercise is one of the most important things you can do for your health. Most adults should get at least 150 minutes of moderate-intensity exercise (any activity that increases your heart rate and causes you to sweat) each week. In addition, most adults need muscle-strengthening exercises on 2 or more days a week.   Maintain a healthy weight. The body mass index (BMI) is a screening tool to identify possible weight problems. It provides an estimate of body fat  based on height and weight. Your health care provider can find your BMI, and can help you achieve or maintain a healthy weight.For adults 20 years and older:   - A BMI below 18.5 is considered underweight.   - A BMI of 18.5 to 24.9 is normal.   - A BMI of 25 to 29.9 is considered overweight.   - A BMI of 30 and above is considered obese.   Maintain normal blood lipids and cholesterol levels by exercising and minimizing your intake of trans and saturated fats.  Eat a balanced diet with plenty of fruit and vegetables. Blood tests for lipids and cholesterol should begin at age 78 and be repeated every 5 years minimum.  If your lipid or cholesterol levels are high, you are over 40, or you are at high risk for heart disease, you may need your cholesterol levels checked more frequently.Ongoing high lipid and cholesterol levels should be treated with medicines if diet and exercise are not working.   If you smoke, find out from your health care provider how to quit. If you do not use tobacco, do not start.   Lung cancer screening is recommended for adults aged 13-80 years who are at high risk for developing lung cancer because of a history of smoking. A yearly low-dose CT scan of the lungs is recommended for people who have at least a 30-pack-year history of smoking and are a current smoker or have quit within the past 15 years. A pack year  of smoking is smoking an average of 1 pack of cigarettes a day for 1 year (for example: 1 pack a day for 30 years or 2 packs a day for 15 years). Yearly screening should continue until the smoker has stopped smoking for at least 15 years. Yearly screening should be stopped for people who develop a health problem that would prevent them from having lung cancer treatment.   If you are pregnant, do not drink alcohol. If you are breastfeeding, be very cautious about drinking alcohol. If you are not pregnant and choose to drink alcohol, do not have more than 1 drink per day.  One drink is considered to be 12 ounces (355 mL) of beer, 5 ounces (148 mL) of wine, or 1.5 ounces (44 mL) of liquor.   Avoid use of street drugs. Do not share needles with anyone. Ask for help if you need support or instructions about stopping the use of drugs.   High blood pressure causes heart disease and increases the risk of stroke. Your blood pressure should be checked at least yearly.  Ongoing high blood pressure should be treated with medicines if weight loss and exercise do not work.   If you are 87-63 years old, ask your health care provider if you should take aspirin to prevent strokes.   Diabetes screening involves taking a blood sample to check your fasting blood sugar level. This should be done once every 3 years, after age 80, if you are within normal weight and without risk factors for diabetes. Testing should be considered at a younger age or be carried out more frequently if you are overweight and have at least 1 risk factor for diabetes.   Breast cancer screening is essential preventive care for women. You should practice "breast self-awareness."  This means understanding the normal appearance and feel of your breasts and may include breast self-examination.  Any changes detected, no matter how small, should be reported to a health care provider.  Women in their 86s and 30s should have a clinical breast exam (CBE) by a health care provider as part of a regular health exam every 1 to 3 years.  After age 66, women should have a CBE every year.  Starting at age 35, women should consider having a mammogram (breast X-ray test) every year.  Women who have a family history of breast cancer should talk to their health care provider about genetic screening.  Women at a high risk of breast cancer should talk to their health care providers about having an MRI and a mammogram every year.   -Breast cancer gene (BRCA)-related cancer risk assessment is recommended for women who have family  members with BRCA-related cancers. BRCA-related cancers include breast, ovarian, tubal, and peritoneal cancers. Having family members with these cancers may be associated with an increased risk for harmful changes (mutations) in the breast cancer genes BRCA1 and BRCA2. Results of the assessment will determine the need for genetic counseling and BRCA1 and BRCA2 testing.   The Pap test is a screening test for cervical cancer. A Pap test can show cell changes on the cervix that might become cervical cancer if left untreated. A Pap test is a procedure in which cells are obtained and examined from the lower end of the uterus (cervix).   - Women should have a Pap test starting at age 72.   - Between ages 57 and 44, Pap tests should be repeated every 2 years.   - Beginning at age 3,  you should have a Pap test every 3 years as long as the past 3 Pap tests have been normal.   - Some women have medical problems that increase the chance of getting cervical cancer. Talk to your health care provider about these problems. It is especially important to talk to your health care provider if a new problem develops soon after your last Pap test. In these cases, your health care provider may recommend more frequent screening and Pap tests.   - The above recommendations are the same for women who have or have not gotten the vaccine for human papillomavirus (HPV).   - If you had a hysterectomy for a problem that was not cancer or a condition that could lead to cancer, then you no longer need Pap tests. Even if you no longer need a Pap test, a regular exam is a good idea to make sure no other problems are starting.   - If you are between ages 78 and 48 years, and you have had normal Pap tests going back 10 years, you no longer need Pap tests. Even if you no longer need a Pap test, a regular exam is a good idea to make sure no other problems are starting.   - If you have had past treatment for cervical cancer or a  condition that could lead to cancer, you need Pap tests and screening for cancer for at least 20 years after your treatment.   - If Pap tests have been discontinued, risk factors (such as a new sexual partner) need to be reassessed to determine if screening should be resumed.   - The HPV test is an additional test that may be used for cervical cancer screening. The HPV test looks for the virus that can cause the cell changes on the cervix. The cells collected during the Pap test can be tested for HPV. The HPV test could be used to screen women aged 29 years and older, and should be used in women of any age who have unclear Pap test results. After the age of 61, women should have HPV testing at the same frequency as a Pap test.   Colorectal cancer can be detected and often prevented. Most routine colorectal cancer screening begins at the age of 64 years and continues through age 8 years. However, your health care provider may recommend screening at an earlier age if you have risk factors for colon cancer. On a yearly basis, your health care provider may provide home test kits to check for hidden blood in the stool.  Use of a small camera at the end of a tube, to directly examine the colon (sigmoidoscopy or colonoscopy), can detect the earliest forms of colorectal cancer. Talk to your health care provider about this at age 66, when routine screening begins. Direct exam of the colon should be repeated every 5 -10 years through age 63 years, unless early forms of pre-cancerous polyps or small growths are found.   People who are at an increased risk for hepatitis B should be screened for this virus. You are considered at high risk for hepatitis B if:  -You were born in a country where hepatitis B occurs often. Talk with your health care provider about which countries are considered high risk.  - Your parents were born in a high-risk country and you have not received a shot to protect against hepatitis B  (hepatitis B vaccine).  - You have HIV or AIDS.  - You use needles  to inject street drugs.  - You live with, or have sex with, someone who has Hepatitis B.  - You get hemodialysis treatment.  - You take certain medicines for conditions like cancer, organ transplantation, and autoimmune conditions.   Hepatitis C blood testing is recommended for all people born from [birth year] through 1965 and any individual with known risks for hepatitis C.   Practice safe sex. Use condoms and avoid high-risk sexual practices to reduce the spread of sexually transmitted infections (STIs). STIs include gonorrhea, chlamydia, syphilis, trichomonas, herpes, HPV, and human immunodeficiency virus (HIV). Herpes, HIV, and HPV are viral illnesses that have no cure. They can result in disability, cancer, and death. Sexually active women aged 110 years and younger should be checked for chlamydia. Older women with new or multiple partners should also be tested for chlamydia. Testing for other STIs is recommended if you are sexually active and at increased risk.   Osteoporosis is a disease in which the bones lose minerals and strength with aging. This can result in serious bone fractures or breaks. The risk of osteoporosis can be identified using a bone density scan. Women ages 3 years and over and women at risk for fractures or osteoporosis should discuss screening with their health care providers. Ask your health care provider whether you should take a calcium supplement or vitamin D to There are also several preventive steps women can take to avoid osteoporosis and resulting fractures or to keep osteoporosis from worsening. -->Recommendations include:  Eat a balanced diet high in fruits, vegetables, calcium, and vitamins.  Get enough calcium. The recommended total intake of is 1,200 mg daily; for best absorption, if taking supplements, divide doses into 250-500 mg doses throughout the day. Of the two types of calcium, calcium  carbonate is best absorbed when taken with food but calcium citrate can be taken on an empty stomach.  Get enough vitamin D. [employer] and the [place] recommend at least 1,000 IU per day for women age 38 and over who are at risk of vitamin D deficiency. Vitamin D deficiency can be caused by inadequate sun exposure (for example, those who live in [place]).  Avoid alcohol and smoking. Heavy alcohol intake (more than 7 drinks per week) increases the risk of falls and hip fracture and women smokers tend to lose bone more rapidly and have lower bone mass than nonsmokers. Stopping smoking is one of the most important changes women can make to improve their health and decrease risk for disease.  Be physically active every day. Weight-bearing exercise (for example, fast walking, hiking, jogging, and weight training) may strengthen bones or slow the rate of bone loss that comes with aging. Balancing and muscle-strengthening exercises can reduce the risk of falling and fracture.  Consider therapeutic medications. Currently, several types of effective drugs are available. Healthcare providers can recommend the type most appropriate for each woman.  Eliminate environmental factors that may contribute to accidents. Falls cause nearly 90% of all osteoporotic fractures, so reducing this risk is an important bone-health strategy. Measures include ample lighting, removing obstructions to walking, using nonskid rugs on floors, and placing mats and/or grab bars in showers.  Be aware of medication side effects. Some common medicines make bones weaker. These include a type of steroid drug called glucocorticoids used for arthritis and asthma, some antiseizure drugs, certain sleeping pills, treatments for endometriosis, and some cancer drugs. An overactive thyroid gland or using too much thyroid hormone for an underactive thyroid can also  be a problem. If you are taking these medicines, talk to  your doctor about what you can do to help protect your bones.reduce the rate of osteoporosis.    Menopause can be associated with physical symptoms and risks. Hormone replacement therapy is available to decrease symptoms and risks. You should talk to your health care provider about whether hormone replacement therapy is right for you.   Use sunscreen. Apply sunscreen liberally and repeatedly throughout the day. You should seek shade when your shadow is shorter than you. Protect yourself by wearing long sleeves, pants, a wide-brimmed hat, and sunglasses year round, whenever you are outdoors.   Once a month, do a whole body skin exam, using a mirror to look at the skin on your back. Tell your health care provider of new moles, moles that have irregular borders, moles that are larger than a pencil eraser, or moles that have changed in shape or color.   -Stay current with required vaccines (immunizations).   Influenza vaccine. All adults should be immunized every year.  Tetanus, diphtheria, and acellular pertussis (Td, Tdap) vaccine. Pregnant women should receive 1 dose of Tdap vaccine during each pregnancy. The dose should be obtained regardless of the length of time since the last dose. Immunization is preferred during the 27th 36th week of gestation. An adult who has not previously received Tdap or who does not know her vaccine status should receive 1 dose of Tdap. This initial dose should be followed by tetanus and diphtheria toxoids (Td) booster doses every 10 years. Adults with an unknown or incomplete history of completing a 3-dose immunization series with Td-containing vaccines should begin or complete a primary immunization series including a Tdap dose. Adults should receive a Td booster every 10 years.  Varicella vaccine. An adult without evidence of immunity to varicella should receive 2 doses or a second dose if she has previously received 1 dose. Pregnant females who do not have evidence  of immunity should receive the first dose after pregnancy. This first dose should be obtained before leaving the health care facility. The second dose should be obtained 4 8 weeks after the first dose.  Human papillomavirus (HPV) vaccine. Females aged 39 26 years who have not received the vaccine previously should obtain the 3-dose series. The vaccine is not recommended for use in pregnant females. However, pregnancy testing is not needed before receiving a dose. If a female is found to be pregnant after receiving a dose, no treatment is needed. In that case, the remaining doses should be delayed until after the pregnancy. Immunization is recommended for any person with an immunocompromised condition through the age of 82 years if she did not get any or all doses earlier. During the 3-dose series, the second dose should be obtained 4 8 weeks after the first dose. The third dose should be obtained 24 weeks after the first dose and 16 weeks after the second dose.  Zoster vaccine. One dose is recommended for adults aged 31 years or older unless certain conditions are present.  Measles, mumps, and rubella (MMR) vaccine. Adults born before [birth year] generally are considered immune to measles and mumps. Adults born in [birth year] or later should have 1 or more doses of MMR vaccine unless there is a contraindication to the vaccine or there is laboratory evidence of immunity to each of the three diseases. A routine second dose of MMR vaccine should be obtained at least 28 days after the first dose for students attending postsecondary schools, health  care workers, or international travelers. People who received inactivated measles vaccine or an unknown type of measles vaccine during 1963 1967 should receive 2 doses of MMR vaccine. People who received inactivated mumps vaccine or an unknown type of mumps vaccine before 1979 and are at high risk for mumps infection should consider immunization with 2 doses of MMR vaccine. For  females of childbearing age, rubella immunity should be determined. If there is no evidence of immunity, females who are not pregnant should be vaccinated. If there is no evidence of immunity, females who are pregnant should delay immunization until after pregnancy. Unvaccinated health care workers born before [birth year] who lack laboratory evidence of measles, mumps, or rubella immunity or laboratory confirmation of disease should consider measles and mumps immunization with 2 doses of MMR vaccine or rubella immunization with 1 dose of MMR vaccine.  Pneumococcal 13-valent conjugate (PCV13) vaccine. When indicated, a person who is uncertain of her immunization history and has no record of immunization should receive the PCV13 vaccine. An adult aged 71 years or older who has certain medical conditions and has not been previously immunized should receive 1 dose of PCV13 vaccine. This PCV13 should be followed with a dose of pneumococcal polysaccharide (PPSV23) vaccine. The PPSV23 vaccine dose should be obtained at least 8 weeks after the dose of PCV13 vaccine. An adult aged 38 years or older who has certain medical conditions and previously received 1 or more doses of PPSV23 vaccine should receive 1 dose of PCV13. The PCV13 vaccine dose should be obtained 1 or more years after the last PPSV23 vaccine dose.  Pneumococcal polysaccharide (PPSV23) vaccine. When PCV13 is also indicated, PCV13 should be obtained first. All adults aged 68 years and older should be immunized. An adult younger than age 11 years who has certain medical conditions should be immunized. Any person who resides in a nursing home or long-term care facility should be immunized. An adult smoker should be immunized. People with an immunocompromised condition and certain other conditions should receive both PCV13 and PPSV23 vaccines. People with human immunodeficiency virus (HIV) infection should be immunized as soon as possible after diagnosis.  Immunization during chemotherapy or radiation therapy should be avoided. Routine use of PPSV23 vaccine is not recommended for American Indians, [place] Natives, or people younger than 65 years unless there are medical conditions that require PPSV23 vaccine. When indicated, people who have unknown immunization and have no record of immunization should receive PPSV23 vaccine. One-time revaccination 5 years after the first dose of PPSV23 is recommended for people aged 57 64 years who have chronic kidney failure, nephrotic syndrome, asplenia, or immunocompromised conditions. People who received 1 2 doses of PPSV23 before age 67 years should receive another dose of PPSV23 vaccine at age 28 years or later if at least 5 years have passed since the previous dose. Doses of PPSV23 are not needed for people immunized with PPSV23 at or after age 20 years.  Meningococcal vaccine. Adults with asplenia or persistent complement component deficiencies should receive 2 doses of quadrivalent meningococcal conjugate (MenACWY-D) vaccine. The doses should be obtained at least 2 months apart. Microbiologists working with certain meningococcal bacteria, [place] recruits, people at risk during an outbreak, and people who travel to or live in countries with a high rate of meningitis should be immunized. A first-year college student up through age 64 years who is living in a residence hall should receive a dose if she did not receive a dose on or after her 16th birthday.  Adults who have certain high-risk conditions should receive one or more doses of vaccine.  Hepatitis A vaccine. Adults who wish to be protected from this disease, have certain high-risk conditions, work with hepatitis A-infected animals, work in hepatitis A research labs, or travel to or work in countries with a high rate of hepatitis A should be immunized. Adults who were previously unvaccinated and who anticipate close contact with an international adoptee during the  first 60 days after arrival in the [place] from a country with a high rate of hepatitis A should be immunized.  Hepatitis B vaccine.  Adults who wish to be protected from this disease, have certain high-risk conditions, may be exposed to blood or other infectious body fluids, are household contacts or sex partners of hepatitis B positive people, are clients or workers in certain care facilities, or travel to or work in countries with a high rate of hepatitis B should be immunized.  Haemophilus influenzae type b (Hib) vaccine. A previously unvaccinated person with asplenia or sickle cell disease or having a scheduled splenectomy should receive 1 dose of Hib vaccine. Regardless of previous immunization, a recipient of a hematopoietic stem cell transplant should receive a 3-dose series 6 12 months after her successful transplant. Hib vaccine is not recommended for adults with HIV infection.  Preventive Services / Frequency Ages 70 to 39years  Blood pressure check.** / Every 1 to 2 years.  Lipid and cholesterol check.** / Every 5 years beginning at age 64.  Clinical breast exam.** / Every 3 years for women in their 35s and 81s.  BRCA-related cancer risk assessment.** / For women who have family members with a BRCA-related cancer (breast, ovarian, tubal, or peritoneal cancers).  Pap test.** / Every 2 years from ages 87 through 68. Every 3 years starting at age 26 through age 20 or 63 with a history of 3 consecutive normal Pap tests.  HPV screening.** / Every 3 years from ages 85 through ages 8 to 50 with a history of 3 consecutive normal Pap tests.  Hepatitis C blood test.** / For any individual with known risks for hepatitis C.  Skin self-exam. / Monthly.  Influenza vaccine. / Every year.  Tetanus, diphtheria, and acellular pertussis (Tdap, Td) vaccine.** / Consult your health care provider. Pregnant women should receive 1 dose of Tdap vaccine during each pregnancy. 1 dose of Td every  10 years.  Varicella vaccine.** / Consult your health care provider. Pregnant females who do not have evidence of immunity should receive the first dose after pregnancy.  HPV vaccine. / 3 doses over 6 months, if 78 and younger. The vaccine is not recommended for use in pregnant females. However, pregnancy testing is not needed before receiving a dose.  Measles, mumps, rubella (MMR) vaccine.** / You need at least 1 dose of MMR if you were born in 1957 or later. You may also need a 2nd dose. For females of childbearing age, rubella immunity should be determined. If there is no evidence of immunity, females who are not pregnant should be vaccinated. If there is no evidence of immunity, females who are pregnant should delay immunization until after pregnancy.  Pneumococcal 13-valent conjugate (PCV13) vaccine.** / Consult your health care provider.  Pneumococcal polysaccharide (PPSV23) vaccine.** / 1 to 2 doses if you smoke cigarettes or if you have certain conditions.  Meningococcal vaccine.** / 1 dose if you are age 33 to 43 years and a Market researcher living in a residence hall, or have  one of several medical conditions, you need to get vaccinated against meningococcal disease. You may also need additional booster doses.  Hepatitis A vaccine.** / Consult your health care provider.  Hepatitis B vaccine.** / Consult your health care provider.  Haemophilus influenzae type b (Hib) vaccine.** / Consult your health care provider.  Ages 38 to 64years  Blood pressure check.** / Every 1 to 2 years.  Lipid and cholesterol check.** / Every 5 years beginning at age 22 years.  Lung cancer screening. / Every year if you are aged 20 80 years and have a 30-pack-year history of smoking and currently smoke or have quit within the past 15 years. Yearly screening is stopped once you have quit smoking for at least 15 years or develop a health problem that would prevent you from having lung cancer  treatment.  Clinical breast exam.** / Every year after age 77 years.  BRCA-related cancer risk assessment.** / For women who have family members with a BRCA-related cancer (breast, ovarian, tubal, or peritoneal cancers).  Mammogram.** / Every year beginning at age 24 years and continuing for as long as you are in good health. Consult with your health care provider.  Pap test.** / Every 3 years starting at age 13 years through age 12 or 61 years with a history of 3 consecutive normal Pap tests.  HPV screening.** / Every 3 years from ages 31 years through ages 22 to 63 years with a history of 3 consecutive normal Pap tests.  Fecal occult blood test (FOBT) of stool. / Every year beginning at age 55 years and continuing until age 63 years. You may not need to do this test if you get a colonoscopy every 10 years.  Flexible sigmoidoscopy or colonoscopy.** / Every 5 years for a flexible sigmoidoscopy or every 10 years for a colonoscopy beginning at age 26 years and continuing until age 72 years.  Hepatitis C blood test.** / For all people born from [birth year] through 1964 and any individual with known risks for hepatitis C.  Skin self-exam. / Monthly.  Influenza vaccine. / Every year.  Tetanus, diphtheria, and acellular pertussis (Tdap/Td) vaccine.** / Consult your health care provider. Pregnant women should receive 1 dose of Tdap vaccine during each pregnancy. 1 dose of Td every 10 years.  Varicella vaccine.** / Consult your health care provider. Pregnant females who do not have evidence of immunity should receive the first dose after pregnancy.  Zoster vaccine.** / 1 dose for adults aged 24 years or older.  Measles, mumps, rubella (MMR) vaccine.** / You need at least 1 dose of MMR if you were born in 1957 or later. You may also need a 2nd dose. For females of childbearing age, rubella immunity should be determined. If there is no evidence of immunity, females who are not pregnant should be  vaccinated. If there is no evidence of immunity, females who are pregnant should delay immunization until after pregnancy.  Pneumococcal 13-valent conjugate (PCV13) vaccine.** / Consult your health care provider.  Pneumococcal polysaccharide (PPSV23) vaccine.** / 1 to 2 doses if you smoke cigarettes or if you have certain conditions.  Meningococcal vaccine.** / Consult your health care provider.  Hepatitis A vaccine.** / Consult your health care provider.  Hepatitis B vaccine.** / Consult your health care provider.  Haemophilus influenzae type b (Hib) vaccine.** / Consult your health care provider.  Ages 79 years and over  Blood pressure check.** / Every 1 to 2 years.  Lipid and cholesterol check.** / Every  5 years beginning at age 56 years.  Lung cancer screening. / Every year if you are aged 14 80 years and have a 30-pack-year history of smoking and currently smoke or have quit within the past 15 years. Yearly screening is stopped once you have quit smoking for at least 15 years or develop a health problem that would prevent you from having lung cancer treatment.  Clinical breast exam.** / Every year after age 93 years.  BRCA-related cancer risk assessment.** / For women who have family members with a BRCA-related cancer (breast, ovarian, tubal, or peritoneal cancers).  Mammogram.** / Every year beginning at age 17 years and continuing for as long as you are in good health. Consult with your health care provider.  Pap test.** / Every 3 years starting at age 8 years through age 31 or 24 years with 3 consecutive normal Pap tests. Testing can be stopped between 65 and 70 years with 3 consecutive normal Pap tests and no abnormal Pap or HPV tests in the past 10 years.  HPV screening.** / Every 3 years from ages 36 years through ages 81 or 83 years with a history of 3 consecutive normal Pap tests. Testing can be stopped between 65 and 70 years with 3 consecutive normal Pap tests and no  abnormal Pap or HPV tests in the past 10 years.  Fecal occult blood test (FOBT) of stool. / Every year beginning at age 55 years and continuing until age 54 years. You may not need to do this test if you get a colonoscopy every 10 years.  Flexible sigmoidoscopy or colonoscopy.** / Every 5 years for a flexible sigmoidoscopy or every 10 years for a colonoscopy beginning at age 59 years and continuing until age 56 years.  Hepatitis C blood test.** / For all people born from [birth year] through 1964 and any individual with known risks for hepatitis C.  Osteoporosis screening.** / A one-time screening for women ages 77 years and over and women at risk for fractures or osteoporosis.  Skin self-exam. / Monthly.  Influenza vaccine. / Every year.  Tetanus, diphtheria, and acellular pertussis (Tdap/Td) vaccine.** / 1 dose of Td every 10 years.  Varicella vaccine.** / Consult your health care provider.  Zoster vaccine.** / 1 dose for adults aged 65 years or older.  Pneumococcal 13-valent conjugate (PCV13) vaccine.** / Consult your health care provider.  Pneumococcal polysaccharide (PPSV23) vaccine.** / 1 dose for all adults aged 63 years and older.  Meningococcal vaccine.** / Consult your health care provider.  Hepatitis A vaccine.** / Consult your health care provider.  Hepatitis B vaccine.** / Consult your health care provider.  Haemophilus influenzae type b (Hib) vaccine.** / Consult your health care provider. ** Family history and personal history of risk and conditions may change your health care provider's recommendations. Document Released: 01/01/2002 Document Revised: 08/26/2013  [facility] Patient Information 2014 [place], [place].   EXERCISE AND DIET:  We recommended that you start or continue a regular exercise program for good health. Regular exercise means any activity that makes your heart beat faster and makes you sweat.  We recommend exercising at least 30 minutes per day at least 3  days a week, preferably 5.  We also recommend a diet low in fat and sugar / carbohydrates.  Inactivity, poor dietary choices and obesity can cause diabetes, heart attack, stroke, and kidney damage, among others.     ALCOHOL AND SMOKING:  Women should limit their alcohol intake to no more than 7 drinks/beers/glasses  of wine (combined, not each!) per week. Moderation of alcohol intake to this level decreases your risk of breast cancer and liver damage.  ( And of course, no recreational drugs are part of a healthy lifestyle.)  Also, you should not be smoking at all or even being exposed to second hand smoke. Most people know smoking can cause cancer, and various heart and lung diseases, but did you know it also contributes to weakening of your bones?  Aging of your skin?  Yellowing of your teeth and nails?   CALCIUM AND VITAMIN D:  Adequate intake of calcium and Vitamin D are recommended.  The recommendations for exact amounts of these supplements seem to change often, but generally speaking 600 mg of calcium (either carbonate or citrate) and 800 units of Vitamin D per day seems prudent. Certain women may benefit from higher intake of Vitamin D.  If you are among these women, your doctor will have told you during your visit.     PAP SMEARS:  Pap smears, to check for cervical cancer or precancers,  have traditionally been done yearly, although recent scientific advances have shown that most women can have pap smears less often.  However, every woman still should have a physical exam from her gynecologist or primary care physician every year. It will include a breast check, inspection of the vulva and vagina to check for abnormal growths or skin changes, a visual exam of the cervix, and then an exam to evaluate the size and shape of the uterus and ovaries.  And after 50 years of age, a rectal exam is indicated to check for rectal cancers. We will also provide age appropriate advice regarding health  maintenance, like when you should have certain vaccines, screening for sexually transmitted diseases, bone density testing, colonoscopy, mammograms, etc.    MAMMOGRAMS:  All women over 42 years old should have a yearly mammogram. Many facilities now offer a "3D" mammogram, which may cost around $50 extra out of pocket. If possible,  we recommend you accept the option to have the 3D mammogram performed.  It both reduces the number of women who will be called back for extra views which then turn out to be normal, and it is better than the routine mammogram at detecting truly abnormal areas.     COLONOSCOPY:  Colonoscopy to screen for colon cancer is recommended for all women at age 44.  We know, you hate the idea of the prep.  We agree, BUT, having colon cancer and not knowing it is worse!!  Colon cancer so often starts as a polyp that can be seen and removed at colonscopy, which can quite literally save your life!  And if your first colonoscopy is normal and you have no family history of colon cancer, most women don't have to have it again for 10 years.  Once every ten years, you can do something that may end up saving your life, right?  We will be happy to help you get it scheduled when you are ready.  Be sure to check your insurance coverage so you understand how much it will cost.  It may be covered as a preventative service at no cost, but you should check your particular policy.

## 2017-10-24 NOTE — Addendum Note (Signed)
Addended by: Leda MinPULLIAM, MELISSA D on: 10/24/2017 04:22 PM   Modules accepted: Orders

## 2017-12-04 ENCOUNTER — Ambulatory Visit (AMBULATORY_SURGERY_CENTER): Payer: Self-pay | Admitting: *Deleted

## 2017-12-04 ENCOUNTER — Other Ambulatory Visit: Payer: Self-pay

## 2017-12-04 VITALS — Ht 66.5 in | Wt 193.0 lb

## 2017-12-04 DIAGNOSIS — Z1211 Encounter for screening for malignant neoplasm of colon: Secondary | ICD-10-CM

## 2017-12-04 MED ORDER — NA SULFATE-K SULFATE-MG SULF 17.5-3.13-1.6 GM/177ML PO SOLN: 1.0000 | Freq: Once | ORAL | 0 refills | Status: AC

## 2017-12-04 NOTE — Progress Notes (Signed)
No egg or soy allergy known to patient  No issues with past sedation with any surgeries  or procedures, no intubation problems  No diet pills per patient No home 02 use per patient  No blood thinners per patient  Pt denies issues with constipation  No A fib or A flutter  EMMI video sent to pt's e mail  $15 coupon for suprep to pt in [place]

## 2017-12-11 ENCOUNTER — Encounter: Payer: Self-pay | Admitting: Gastroenterology

## 2017-12-18 ENCOUNTER — Ambulatory Visit (AMBULATORY_SURGERY_CENTER): Payer: 59 | Admitting: Gastroenterology

## 2017-12-18 ENCOUNTER — Encounter: Payer: Self-pay | Admitting: Gastroenterology

## 2017-12-18 VITALS — BP 105/64 | HR 65 | Temp 98.4°F | Resp 12 | Ht 67.0 in | Wt 193.0 lb

## 2017-12-18 DIAGNOSIS — Z1212 Encounter for screening for malignant neoplasm of rectum: Secondary | ICD-10-CM | POA: Diagnosis not present

## 2017-12-18 DIAGNOSIS — Z1211 Encounter for screening for malignant neoplasm of colon: Secondary | ICD-10-CM

## 2017-12-18 MED ORDER — SODIUM CHLORIDE 0.9 % IV SOLN: 500.0000 mL | Freq: Once | INTRAVENOUS | Status: DC

## 2017-12-18 NOTE — Patient Instructions (Signed)
YOU HAD AN ENDOSCOPIC PROCEDURE TODAY AT THE [facility]:   Refer to the procedure report that was given to you for any specific questions about what was found during the examination.  If the procedure report does not answer your questions, please call your gastroenterologist to clarify.  If you requested that your care partner not be given the details of your procedure findings, then the procedure report has been included in a sealed envelope for you to review at your convenience later.  YOU SHOULD EXPECT: Some feelings of bloating in the abdomen. Passage of more gas than usual.  Walking can help get rid of the air that was put into your GI tract during the procedure and reduce the bloating. If you had a lower endoscopy (such as a colonoscopy or flexible sigmoidoscopy) you may notice spotting of blood in your stool or on the toilet paper. If you underwent a bowel prep for your procedure, you may not have a normal bowel movement for a few days.  Please Note:  You might notice some irritation and congestion in your nose or some drainage.  This is from the oxygen used during your procedure.  There is no need for concern and it should clear up in a day or so.  SYMPTOMS TO REPORT IMMEDIATELY:   Following lower endoscopy (colonoscopy or flexible sigmoidoscopy):  Excessive amounts of blood in the stool  Significant tenderness or worsening of abdominal pains  Swelling of the abdomen that is new, acute  Fever of 100F or higher  For urgent or emergent issues, a gastroenterologist can be reached at any hour by calling [phone].   DIET:  We do recommend a small meal at first, but then you may proceed to your regular diet.  Drink plenty of fluids but you should avoid alcoholic beverages for 24 hours.  ACTIVITY:  You should plan to take it easy for the rest of today and you should NOT DRIVE or use heavy machinery until tomorrow (because of the sedation medicines used during the test).     FOLLOW UP: Our staff will call the number listed on your records the next business day following your procedure to check on you and address any questions or concerns that you may have regarding the information given to you following your procedure. If we do not reach you, we will leave a message.  However, if you are feeling well and you are not experiencing any problems, there is no need to return our call.  We will assume that you have returned to your regular daily activities without incident.  If any biopsies were taken you will be contacted by phone or by letter within the next 1-3 weeks.  Please call us at [phone] if you have not heard about the biopsies in 3 weeks.    SIGNATURES/CONFIDENTIALITY: You and/or your care partner have signed paperwork which will be entered into your electronic medical record.  These signatures attest to the fact that that the information above on your After Visit Summary has been reviewed and is understood.  Full responsibility of the confidentiality of this discharge information lies with you and/or your care-partner. 

## 2017-12-18 NOTE — Progress Notes (Signed)
A and O x3. Report to RN. Tolerated MAC anesthesia well.

## 2017-12-18 NOTE — Op Note (Signed)
[facility] Patient Name: Stephanie Chan Procedure Date: 12/18/2017 7:55 AM MRN: [id number] Endoscopist: Sherilyn Cooter L. Myrtie Neither , MD Age: 52 Referring MD:  Date of Birth: 1966/05/07 Gender: Female Account #: [id number] Procedure:                Colonoscopy Indications:              Screening for colorectal malignant neoplasm, This                            is the patient's first colonoscopy Medicines:                Monitored Anesthesia Care Procedure:                Pre-Anesthesia Assessment:                           - Prior to the procedure, a History and Physical                            was performed, and patient medications and                            allergies were reviewed. The patient's tolerance of                            previous anesthesia was also reviewed. The risks                            and benefits of the procedure and the sedation                            options and risks were discussed with the patient.                            All questions were answered, and informed consent                            was obtained. Anticoagulants: The patient has taken                            aspirin. It was decided not to withhold this                            medication prior to the procedure. ASA Grade                            Assessment: II - A patient with mild systemic                            disease. After reviewing the risks and benefits,                            the patient was deemed in satisfactory condition to  undergo the procedure.                           After obtaining informed consent, the colonoscope                            was passed under direct vision. Throughout the                            procedure, the patient's blood pressure, pulse, and                            oxygen saturations were monitored continuously. The                            Colonoscope was introduced through the anus and                             advanced to the the cecum, identified by                            appendiceal orifice and ileocecal valve. The                            colonoscopy was performed without difficulty. The                            patient tolerated the procedure well. The quality                            of the bowel preparation was excellent. The                            ileocecal valve, appendiceal orifice, and rectum                            were photographed. The quality of the bowel                            preparation was evaluated using the BBPS [facility]                            Bowel Preparation Scale) with scores of: Right                            Colon = 3, Transverse Colon = 3 and Left Colon = 3                            (entire mucosa seen well with no residual staining,                            small fragments of stool or opaque liquid). The  total BBPS score equals 9. Scope In: 8:01:25 AM Scope Out: 8:15:47 AM Scope Withdrawal Time: 0 hours 9 minutes 56 seconds  Total Procedure Duration: 0 hours 14 minutes 22 seconds  Findings:                 The perianal and digital rectal examinations were                            normal.                           The entire examined colon appeared normal on direct                            and retroflexion views. Complications:            No immediate complications. Estimated Blood Loss:     Estimated blood loss: none. Impression:               - The entire examined colon is normal on direct and                            retroflexion views.                           - No specimens collected. Recommendation:           - Patient has a contact number available for                            emergencies. The signs and symptoms of potential                            delayed complications were discussed with the                            patient. Return to normal activities tomorrow.                             Written discharge instructions were provided to the                            patient.                           - Resume previous diet.                           - Continue present medications.                           - Repeat colonoscopy in 10 years for screening                            purposes. Henry L. Myrtie Neitheranis, MD 12/18/2017 8:20:39 AM This report has been signed electronically.

## 2017-12-19 ENCOUNTER — Telehealth: Payer: Self-pay | Admitting: *Deleted

## 2017-12-19 NOTE — Telephone Encounter (Signed)
  Follow up Call-  Call back number 12/18/2017  Post procedure Call Back phone  # [phone]  Permission to leave phone message Yes  Some recent data might be hidden     Patient questions:  Do you have a fever, pain , or abdominal swelling? No. Pain Score  0 *  Have you tolerated food without any problems? Yes.    Have you been able to return to your normal activities? Yes.    Do you have any questions about your discharge instructions: Diet   No. Medications  No. Follow up visit  No.  Do you have questions or concerns about your Care? No.  Actions: * If pain score is 4 or above: No action needed, pain <4.

## 2018-01-23 ENCOUNTER — Other Ambulatory Visit: Payer: Self-pay | Admitting: Family Medicine

## 2018-01-23 NOTE — Telephone Encounter (Signed)
Please review.  Last office visit was 10/22/2017 and patient has appointment to follow up in 6 months. Thanks. MPulliam, CMA/RT(R)

## 2018-02-07 ENCOUNTER — Telehealth: Payer: Self-pay | Admitting: Family Medicine

## 2018-02-07 NOTE — Telephone Encounter (Signed)
Patient called states she tried to get refill on Xanax at pharmacy but was told provider approval required---forwarding request to medical assistant .  ALPRAZolam (XANAX) 0.25 MG tablet [[id number]][[id number]]   Order Details  Dose: 0.25 mg Route: Oral Frequency: Daily PRN for anxiety  Indications of Use: Panic Disorder  Dispense Quantity: 30 tablet Refills: 0 Fills remaining: --        Sig: Take 1 tablet (0.25 mg total) by mouth daily as needed for anxiety. anxiety     Pt uses:  Preferred Pharmacies      [facility] [place] [address] [phone] (Phone) [phone] (Fax)    --glh

## 2018-02-07 NOTE — Telephone Encounter (Signed)
Patient last seen 10/22/17.  Please advise. MPulliam, CMA/RT(R)

## 2018-02-10 NOTE — Telephone Encounter (Signed)
Needs appointment to document and discuss her symptoms and have documentation as to why she needs this medicine before refill can be given.  Thank you.

## 2018-02-11 NOTE — Telephone Encounter (Signed)
Left message for a call back to schedule appointment.

## 2018-04-09 ENCOUNTER — Other Ambulatory Visit: Payer: Self-pay | Admitting: Family Medicine

## 2018-04-09 DIAGNOSIS — F4323 Adjustment disorder with mixed anxiety and depressed mood: Secondary | ICD-10-CM

## 2018-04-09 DIAGNOSIS — F329 Major depressive disorder, single episode, unspecified: Secondary | ICD-10-CM

## 2018-04-09 DIAGNOSIS — F32A Depression, unspecified: Secondary | ICD-10-CM

## 2018-05-07 ENCOUNTER — Ambulatory Visit: Payer: Self-pay | Admitting: Family Medicine

## 2018-10-02 ENCOUNTER — Telehealth: Payer: Self-pay | Admitting: Family Medicine

## 2018-10-02 NOTE — Telephone Encounter (Signed)
Patient called states she knew needed to reschedule several missed/ cancelled appointments, but has been feel bad lately with Lt elbow & Rt shoulder pain--   --Pt scheduled OV w/provider for 10/06/18 @ 10am.  ---Patient also is requesting refill on (3) different Rxs :   1)--- ALPRAZolam (XANAX) 0.25 MG tablet [[id number]][[id number]]   Order Details  Dose: 0.25 mg Route: Oral Frequency: Daily PRN for anxiety  Indications of Use: Panic Disorder  Dispense Quantity: 30 tablet Refills: 0 Fills remaining: --        Sig: Take 1 tablet (0.25 mg total) by mouth daily as needed for anxiety. anxiety       2)---buPROPion (WELLBUTRIN XL) 300 MG 24 hr tablet [[id number]][[id number]]   Order Details  Dose, Route, Frequency: As Directed   Dispense Quantity: 90 tablet Refills: 1 Fills remaining: --        Sig: TAKE 1 TABLET BY MOUTH DAILY.          3)---escitalopram (LEXAPRO) 10 MG tablet [[id number]][[id number]]   Order Details  Dose, Route, Frequency: As Directed   Dispense Quantity: 90 tablet Refills: 1 Fills remaining: --        Sig: TAKE 1 TABLET BY MOUTH DAILY.     ----Forwarding message to medical assistant to review w/provider for refill approval. & send to : Preferred Pharmacies      [facility] [place] [address] [phone] (Phone) [phone] (Fax)   --glh

## 2018-10-03 ENCOUNTER — Other Ambulatory Visit: Payer: Self-pay

## 2018-10-03 DIAGNOSIS — F329 Major depressive disorder, single episode, unspecified: Secondary | ICD-10-CM

## 2018-10-03 DIAGNOSIS — F4323 Adjustment disorder with mixed anxiety and depressed mood: Secondary | ICD-10-CM

## 2018-10-03 DIAGNOSIS — F32A Depression, unspecified: Secondary | ICD-10-CM

## 2018-10-03 MED ORDER — BUPROPION HCL ER (XL) 300 MG PO TB24: 300.0000 mg | ORAL_TABLET | Freq: Every day | ORAL | 0 refills | Status: DC

## 2018-10-03 MED ORDER — ESCITALOPRAM OXALATE 10 MG PO TABS: 10.0000 mg | ORAL_TABLET | Freq: Every day | ORAL | 0 refills | Status: DC

## 2018-10-03 NOTE — Telephone Encounter (Signed)
Patient has appointment on Monday 10/06/2018.  Called patient to notified, unable to reach patient. MPulliam, CMA/RT(R)

## 2018-10-03 NOTE — Telephone Encounter (Signed)
30 day supply of medications sent in for patient until follow up on 10/06/2018.  MPulliam, CMA/RT(R)

## 2018-10-03 NOTE — Telephone Encounter (Signed)
Patient called requesting a refill on Xanax.  LOV 10/22/2017.  Patient was to follow up in 6 months but canceled appointment. Patient has appointment to follow up on 10/06/18. Medication was last filled 08/09/2017 for #30 no refills. Pulled [place] controlled substance report and gave to Dr. Sharee Holsterpalski to review. MPulliam, CMA/RT(R)

## 2018-10-03 NOTE — Telephone Encounter (Signed)
30 day supply sent for the Wellbutrin and lexapro. Xanax request sent to Dr. Sharee Holsterpalski to review. MPulliam, CMA/RT(R)

## 2018-10-03 NOTE — Telephone Encounter (Signed)
Patient will need office visit and to discuss condition prior to any refills for Xanax.

## 2018-10-06 ENCOUNTER — Ambulatory Visit (INDEPENDENT_AMBULATORY_CARE_PROVIDER_SITE_OTHER): Payer: 59 | Admitting: Family Medicine

## 2018-10-06 ENCOUNTER — Encounter: Payer: Self-pay | Admitting: Family Medicine

## 2018-10-06 VITALS — BP 107/73 | HR 78 | Temp 98.7°F | Ht 67.0 in | Wt 197.0 lb

## 2018-10-06 DIAGNOSIS — F4323 Adjustment disorder with mixed anxiety and depressed mood: Secondary | ICD-10-CM | POA: Diagnosis not present

## 2018-10-06 DIAGNOSIS — Z23 Encounter for immunization: Secondary | ICD-10-CM

## 2018-10-06 DIAGNOSIS — F32A Depression, unspecified: Secondary | ICD-10-CM

## 2018-10-06 DIAGNOSIS — F410 Panic disorder [episodic paroxysmal anxiety] without agoraphobia: Secondary | ICD-10-CM

## 2018-10-06 DIAGNOSIS — Z9119 Patient's noncompliance with other medical treatment and regimen: Secondary | ICD-10-CM | POA: Diagnosis not present

## 2018-10-06 DIAGNOSIS — F329 Major depressive disorder, single episode, unspecified: Secondary | ICD-10-CM

## 2018-10-06 DIAGNOSIS — Z7189 Other specified counseling: Secondary | ICD-10-CM

## 2018-10-06 DIAGNOSIS — M7021 Olecranon bursitis, right elbow: Secondary | ICD-10-CM | POA: Insufficient documentation

## 2018-10-06 DIAGNOSIS — Z91199 Patient's noncompliance with other medical treatment and regimen due to unspecified reason: Secondary | ICD-10-CM | POA: Insufficient documentation

## 2018-10-06 MED ORDER — BUPROPION HCL ER (XL) 300 MG PO TB24: 300.0000 mg | ORAL_TABLET | Freq: Every day | ORAL | 1 refills | Status: DC

## 2018-10-06 MED ORDER — ALPRAZOLAM 0.5 MG PO TABS: 0.2500 mg | ORAL_TABLET | ORAL | 0 refills | Status: DC | PRN

## 2018-10-06 MED ORDER — ESCITALOPRAM OXALATE 10 MG PO TABS: 10.0000 mg | ORAL_TABLET | Freq: Every day | ORAL | 1 refills | Status: DC

## 2018-10-06 NOTE — Progress Notes (Signed)
Impression and Recommendations:    1. Personal history of noncompliance with medical treatment and regimen   2. Panic anxiety syndrome   3. Depression, unspecified depression type   4. Adjustment disorder with mixed anxiety and depressed mood   5. Counseling on health promotion and disease prevention   6. Olecranon bursitis of right elbow   7. Flu vaccine need    -Importance of taking all medications without lapses in txmnt discussed with patient.    Also explained importance to patient of regular routine follow-up with me so I can ensure her treatment regimen is going well and avoid any terrible mood swings \ increased stress situations due to going off meds etc.  -- >   Patient clearly understands today she will need a follow up at least 3 times a year / q 4 months to evaluate her symptoms on these medications and to follow-up of her other chronic conditions.    Mood: -Advised the patient to use her pill box on weekly basis to fill and take meds accurately..   -Advised that the goal would be for the a 30 tablet supply of Xanax to last 1 year and to only be used for moments of panic attacks.   -Will refill a 6 months prescription of Wellbutrin, and Lexapro today although patient was told to follow-up in 4 months.     -Advised to the patient the many benefits of a Engineer, civil (consulting), to which the patient declines a referral at this time.   -Advised that the patient maintain exercising up to 15 minutes a day to start and work her way up to more and remain hydrated.   -Patient to continue on two 1200 mg tablets BID of fish oil to aid with mood.    Sleep:  -Advised the patient to utilize sleep meditation to aid with helping with sleep.   -Advised that the patient can take up to 10 mg of Melatonin if needed, advised the patient to obtain the 5 mg tablets of Melatonin.    Right Olecranon Bursitis:  -Gave patient educational handouts on this condition in addition to educated her  personally about it.  All questions were answered.    - She understood to avoid applying pressure to the area was the main treatment, along with NSAIDs and ice prn  Advised the patient to ice the area 15-20 minutes up to 3-4 times a day.   -Also advised that ibuprofen may be utilized to aid with pain.    Follow up in 4 months for a recheck on mood medications.    Education and routine counseling performed. Handouts provided.  Orders Placed This Encounter  Procedures  . Flu Vaccine QUAD 6+ mos PF IM (Fluarix Quad PF)    Medications Discontinued During This Encounter  Medication Reason  . ALPRAZolam (XANAX) 0.25 MG tablet Reorder  . buPROPion (WELLBUTRIN XL) 300 MG 24 hr tablet Reorder  . escitalopram (LEXAPRO) 10 MG tablet Reorder     Meds ordered this encounter  Medications  . ALPRAZolam (XANAX) 0.5 MG tablet    Sig: Take 0.5-1 tablets (0.25-0.5 mg total) by mouth as needed for anxiety (panic attack). anxiety    Dispense:  30 tablet    Refill:  0  . buPROPion (WELLBUTRIN XL) 300 MG 24 hr tablet    Sig: Take 1 tablet (300 mg total) by mouth daily.    Dispense:  90 tablet    Refill:  1  . escitalopram (LEXAPRO)  10 MG tablet    Sig: Take 1 tablet (10 mg total) by mouth daily.    Dispense:  90 tablet    Refill:  1    Gross side effects, risk and benefits, and alternatives of medications and treatment plan in general discussed with patient.  Patient is aware that all medications have potential side effects and we are unable to predict every side effect or drug-drug interaction that may occur.   Patient will call with any questions prior to using medication if they have concerns.  Expresses verbal understanding and consents to current therapy and treatment regimen.  No barriers to understanding were identified.  Red flag symptoms and signs discussed in detail.  Patient expressed understanding regarding what to do in case of emergency\urgent symptoms  Please see AVS handed out to  patient at the end of our visit for further patient instructions/ counseling done pertaining to today's office visit.   Return for 2mo f/up mood/ anxiety; sooner prn for shoulder, mood etc.     Note:  This document was prepared using Dragon voice recognition software and may include unintentional dictation errors.  This document serves as a record of services personally performed by Thomasene Lot, DO. It was created on her behalf by Chestine Spore, a trained medical scribe. The creation of this record is based on the scribe's personal observations and the provider's statements to them.   I have reviewed the above medical documentation for accuracy and completeness and I concur.  Thomasene Lot, DO, D.O. 10/06/2018 12:18 PM  --------------------------------------------------------------------------------------------------------------------------------------------------------------------------------------------------------------------------------------    Subjective:    CC:  Chief Complaint  Patient presents with  . Follow-up    HPI: Stephanie Chan is a 52 y.o. female who presents to [facility] today for follow-up of mood.   Mood: She has been off of her Wellbutrin and Lexapro due to not going to pick up her refill as of yet. Her mood hasn't been well lately, however, she notes that she is also going through menopause. She doesn't have a GYN specialist at this time. She reports that she likes to feel prepared and often times plans for the worst.   She doesn't take Xanax often, she only takes Xanax, if she has an upsetting moment in her life. She notes that her dog [place]) is her form of help with her moments of panic. She hasn't been to a counselor as of yet, however, she knows that she will need to follow up with a counselor in the future.   Sleep:  She takes melatonin intermittently at night, she takes at most 2 tablets at night if needed. She  is also taking fish oil to help with her mood.   Vitamin D:  She also takes vitamin D weekly with good tolerance.   Chronic Right Elbow Pain/ mild olecranon bursitis:  She notes that her right elbow pain is only exacerbated with applying pressure to the area- not with any particular movement of the elbow or wrist.     - She also attributes her range of motion/repetitive movements at work throughout the day being the cause of her chronic right elbow pain-  applies pressure to the elbow when on her computer.     Depression screen [facility] 2/9 10/06/2018 10/22/2017 08/09/2017  Decreased Interest 1 1 0  Down, Depressed, Hopeless 1 1 1   PHQ - 2 Score 2 2 1   Altered sleeping 1 1 1   Tired, decreased energy 1 1 1  Change in appetite 1 1 0  Feeling bad or failure about yourself  2 1 1   Trouble concentrating 0 0 1  Moving slowly or fidgety/restless 1 1 1   Suicidal thoughts 0 0 0  PHQ-9 Score 8 7 6   Difficult doing work/chores Somewhat difficult Somewhat difficult Somewhat difficult     GAD 7 : Generalized Anxiety Score 10/06/2018 08/09/2017 04/09/2017  Nervous, Anxious, on Edge 1 1 1   Control/stop worrying 0 0 1  Worry too much - different things 1 1 1   Trouble relaxing 1 1 1   Restless 1 1 1   Easily annoyed or irritable 1 0 1  Afraid - awful might happen 1 0 0  Total GAD 7 Score 6 4 6   Anxiety Difficulty Somewhat difficult Somewhat difficult Somewhat difficult     Wt Readings from Last 3 Encounters:  10/06/18 197 lb (89.4 kg)  12/18/17 193 lb (87.5 kg)  12/04/17 193 lb (87.5 kg)   BP Readings from Last 3 Encounters:  10/06/18 107/73  12/18/17 105/64  10/22/17 121/77   Pulse Readings from Last 3 Encounters:  10/06/18 78  12/18/17 65  10/22/17 75   BMI Readings from Last 3 Encounters:  10/06/18 30.85 kg/m  12/18/17 30.23 kg/m  12/04/17 30.68 kg/m      Patient Care Team    Relationship Specialty Notifications Start End  Thomasene Lotpalski, Shardea Cwynar, DO PCP - General Family Medicine   08/08/16      Patient Active Problem List   Diagnosis Date Noted  . Panic anxiety syndrome 10/06/2018  . Olecranon bursitis of right elbow 10/06/2018  . Personal history of noncompliance with medical treatment and regimen 10/06/2018  . Encounter for wellness examination 10/22/2017  . Counseling on health promotion and disease prevention 10/27/2016  . Vitamin D insufficiency 10/05/2016  . Perimenopausal hot flashes.  08/08/2016  . BMI 30.0-30.9,adult 08/08/2016  . Depression 08/08/2016  . Adjustment disorder with mixed anxiety and depressed mood 08/08/2016  . Environmental and seasonal allergies 08/08/2016  . History of migraines 08/08/2016    Past Medical history, Surgical history, Family history, Social history, Allergies and Medications have been entered into the medical record, reviewed and changed as needed.    Current Meds  Medication Sig  . acetaminophen (TYLENOL) 500 MG tablet Take 1,000 mg by mouth every 6 (six) hours as needed for moderate pain or headache. Pain  . ALPRAZolam (XANAX) 0.5 MG tablet Take 0.5-1 tablets (0.25-0.5 mg total) by mouth as needed for anxiety (panic attack). anxiety  . aspirin EC 81 MG tablet Take 81 mg by mouth daily.  . B Complex-Biotin-FA (B-COMPLEX PO) Take 1 capsule by mouth daily.  Marland Kitchen. buPROPion (WELLBUTRIN XL) 300 MG 24 hr tablet Take 1 tablet (300 mg total) by mouth daily.  . cetirizine-pseudoephedrine (ZYRTEC-D) 5-120 MG per tablet Take 1 tablet by mouth daily as needed for allergies.   . Cholecalciferol (VITAMIN D3) 5000 units CAPS Take 1 capsule (5,000 Units total) by mouth daily.  . Cyanocobalamin (B-12) 1000 MCG TABS Take 1 tablet by mouth daily.  Marland Kitchen. escitalopram (LEXAPRO) 10 MG tablet Take 1 tablet (10 mg total) by mouth daily.  . fexofenadine-pseudoephedrine (ALLEGRA-D) 60-120 MG 12 hr tablet Take 1 tablet by mouth 2 (two) times daily as needed (allergies).  Marland Kitchen. ibuprofen (ADVIL,MOTRIN) 200 MG tablet Take 400 mg by mouth every 6 (six)  hours as needed. Pain  . Multiple Vitamin (MULTIVITAMIN) tablet Take 1 tablet by mouth daily.  . Omega-3 Fatty Acids (FISH OIL) 1000 MG  CAPS Take 1 capsule by mouth.  . [DISCONTINUED] ALPRAZolam (XANAX) 0.25 MG tablet Take 1 tablet (0.25 mg total) by mouth daily as needed for anxiety. anxiety  . [DISCONTINUED] buPROPion (WELLBUTRIN XL) 300 MG 24 hr tablet Take 1 tablet (300 mg total) by mouth daily.  . [DISCONTINUED] escitalopram (LEXAPRO) 10 MG tablet Take 1 tablet (10 mg total) by mouth daily.   Current Facility-Administered Medications for the 10/06/18 encounter (Office Visit) with Thomasene Lot, DO  Medication  . 0.9 %  sodium chloride infusion    Allergies:  No Known Allergies   Review of Systems: Review of Systems: General:   No F/C, wt loss Pulm:   No DIB, SOB, pleuritic chest pain Card:  No CP, palpitations Abd:  No n/v/d or pain Ext:  No inc edema from baseline Psych: no SI/ HI    Objective:   Blood pressure 107/73, pulse 78, temperature 98.7 F (37.1 C), height 5\' 7"  (1.702 m), weight 197 lb (89.4 kg), SpO2 99 %. Body mass index is 30.85 kg/m. General:  Well Developed, well nourished, appropriate for stated age.  Neuro:  Alert and oriented,  extra-ocular muscles intact  HEENT:  Normocephalic, atraumatic, neck supple, no carotid bruits appreciated  Skin:  no gross rash, warm, pink. Cardiac:  RRR, S1 S2 Respiratory:  ECTA B/L and A/P, Not using accessory muscles, speaking in full sentences- unlabored. Vascular:  Ext warm, no cyanosis apprec.; cap RF less 2 sec. Psych:  No HI/SI, judgement and insight good, Euthymic mood. Full Affect.

## 2018-10-06 NOTE — Patient Instructions (Addendum)
Please take two of the 1,200 mg tablets twice a day of fish oil capsules to aid with mood.   You can take up to 10 mg of melatonin a day, please obtain the 5 mg melatonin tablets in order to take either 5 or 10 mg of melatonin as needed to aid with sleep.   For your right Olecranon Bursitis, ice the area 15-20 minutes at a time up to 3-4 times a day.   Please start to walk up to 15 minutes daily with a goal of 30 minutes in the future.   Please see the handout that I printed out from 2 different websites on olecranon bursitis.  Please let me know if this condition does not continue to improve with the conservative treatment we discussed of ice and NSAIDs as needed

## 2019-02-04 ENCOUNTER — Ambulatory Visit: Payer: 59 | Admitting: Family Medicine

## 2019-02-12 ENCOUNTER — Encounter: Payer: Self-pay | Admitting: Family Medicine

## 2019-02-12 ENCOUNTER — Other Ambulatory Visit: Payer: Self-pay

## 2019-02-12 ENCOUNTER — Ambulatory Visit (INDEPENDENT_AMBULATORY_CARE_PROVIDER_SITE_OTHER): Payer: 59 | Admitting: Family Medicine

## 2019-02-12 DIAGNOSIS — M7021 Olecranon bursitis, right elbow: Secondary | ICD-10-CM

## 2019-02-12 DIAGNOSIS — G479 Sleep disorder, unspecified: Secondary | ICD-10-CM | POA: Diagnosis not present

## 2019-02-12 DIAGNOSIS — F32A Depression, unspecified: Secondary | ICD-10-CM

## 2019-02-12 DIAGNOSIS — F4323 Adjustment disorder with mixed anxiety and depressed mood: Secondary | ICD-10-CM

## 2019-02-12 DIAGNOSIS — F410 Panic disorder [episodic paroxysmal anxiety] without agoraphobia: Secondary | ICD-10-CM

## 2019-02-12 DIAGNOSIS — F329 Major depressive disorder, single episode, unspecified: Secondary | ICD-10-CM

## 2019-02-12 MED ORDER — BUPROPION HCL ER (XL) 300 MG PO TB24: 300.0000 mg | ORAL_TABLET | Freq: Every day | ORAL | 0 refills | Status: DC

## 2019-02-12 MED ORDER — ESCITALOPRAM OXALATE 10 MG PO TABS: 10.0000 mg | ORAL_TABLET | Freq: Every day | ORAL | 0 refills | Status: DC

## 2019-02-12 NOTE — Progress Notes (Signed)
I called patient at 9:46 AM and also again at 10:08 AM and left messages on her voicemail box as I could not reach her.  I then told patient to let us reschedule to 2:30 PM in the afternoon.  We will put her on her schedule for that time       Virtual Visit via Telephone Note for [employer], D.O- Primary Care Physician at [facility]   I connected with current patient by telephone and verified that I am speaking with the correct person using two identifiers.   I discussed the limitations, risks, security and privacy concerns of performing an evaluation and management service by telephone and the limited availability of in person appointments during this current national crisis of the Covid-19 pandemic.  My staff members also discussed with the patient that there may be a patient responsible charge related to this service.  The patient expressed understanding and agreed to proceed.     History of Present Illness:    Mood: Patient is maintained on Wellbutrin, Lexapro and told to use Xanax very sparingly.  Our goal was 1  30-day prescription per year.  Patient was last seen October 06, 2018.   In menopause. . - walking 3 times per wk -    Sleep: Patient was told last office visit to take melatonin 10 mg nightly as needed.      Wt Readings from Last 3 Encounters:  10/06/18 197 lb (89.4 kg)  12/18/17 193 lb (87.5 kg)  12/04/17 193 lb (87.5 kg)   BP Readings from Last 3 Encounters:  10/06/18 107/73  12/18/17 105/64  10/22/17 121/77   Pulse Readings from Last 3 Encounters:  10/06/18 78  12/18/17 65  10/22/17 75   BMI Readings from Last 3 Encounters:  10/06/18 30.85 kg/m  12/18/17 30.23 kg/m  12/04/17 30.68 kg/m     Patient Care Team    Relationship Specialty Notifications Start End  Thomasene Lot, DO PCP - General Family Medicine  08/08/16      Patient Active Problem List   Diagnosis Date Noted  . Sleeping difficulties 02/12/2019  . Panic anxiety  syndrome 10/06/2018  . Olecranon bursitis of right elbow 10/06/2018  . Personal history of noncompliance with medical treatment and regimen 10/06/2018  . Encounter for wellness examination 10/22/2017  . Counseling on health promotion and disease prevention 10/27/2016  . Vitamin D insufficiency 10/05/2016  . Perimenopausal hot flashes.  08/08/2016  . BMI 30.0-30.9,adult 08/08/2016  . Depression 08/08/2016  . Adjustment disorder with mixed anxiety and depressed mood 08/08/2016  . Environmental and seasonal allergies 08/08/2016  . History of migraines 08/08/2016     Current Meds  Medication Sig  . acetaminophen (TYLENOL) 500 MG tablet Take 1,000 mg by mouth every 6 (six) hours as needed for moderate pain or headache. Pain  . ALPRAZolam (XANAX) 0.5 MG tablet Take 0.5-1 tablets (0.25-0.5 mg total) by mouth as needed for anxiety (panic attack). anxiety  . aspirin EC 81 MG tablet Take 81 mg by mouth daily.  . B Complex-Biotin-FA (B-COMPLEX PO) Take 1 capsule by mouth daily.  Marland Kitchen buPROPion (WELLBUTRIN XL) 300 MG 24 hr tablet Take 1 tablet (300 mg total) by mouth daily.  . Cholecalciferol (VITAMIN D3) 5000 units CAPS Take 1 capsule (5,000 Units total) by mouth daily.  . Cyanocobalamin (B-12) 1000 MCG TABS Take 1 tablet by mouth daily.  Marland Kitchen escitalopram (LEXAPRO) 10 MG tablet Take 1 tablet (10 mg total) by mouth daily.  . Multiple  Vitamin (MULTIVITAMIN) tablet Take 1 tablet by mouth daily.  . Omega-3 Fatty Acids (FISH OIL) 1000 MG CAPS Take 1 capsule by mouth.  . [DISCONTINUED] buPROPion (WELLBUTRIN XL) 300 MG 24 hr tablet Take 1 tablet (300 mg total) by mouth daily.  . [DISCONTINUED] escitalopram (LEXAPRO) 10 MG tablet Take 1 tablet (10 mg total) by mouth daily.     Allergies:  No Known Allergies   ROS:  See above HPI for pertinent positives and negatives   Objective:   There were no vitals taken for this visit. There is no height or weight on file to calculate BMI. - pt was unable to  obtain vitals   General: sounds in no acute distress.  Skin: Pt confirms warm and dry  extremities and pink fingertips Respiratory: speaking in full sentences, no conversational dyspnea Psych: A and O *3, appears insight good, mood- full      Impression and Recommendations:     1. Panic anxiety syndrome - Patient's mood is stable.  -We discussed various ways p.o. medications that she can help manage her own stress. -She has not been that panicky or emotionally out of control at all. -Still has 20 tablets of Xanax from when the last time I gave her 30 back on 10/06/2018.  2. Adjustment disorder with mixed anxiety and depressed mood Patient well controlled on Wellbutrin and Lexapro -Patient declines need for a change in dosage. -Did discuss all the spokes of the wheel when it comes to mental health including diet high in antioxidants, good sleep, counseling or self-help books, meditation or prayer, and exercise daily at least 30 to 60 minutes.  3. Olecranon bursitis of right elbow -Patient no longer with symptoms.  She said she iced and did but we told her last time and it went away after a couple weeks.  No complaints currently  4. Sleeping difficulties Patient is taking 10 mg melatonin which is working well.  She will continue. -Discussed sleep hygiene  5. Depression, unspecified depression type Mood has been stable on Wellbutrin and Lexapro.   I discussed the assessment and treatment plan with the patient. The patient was provided an opportunity to ask questions and all were answered. The patient agreed with the plan and demonstrated an understanding of the instructions.   The patient was advised to call back or seek an in-person evaluation if the symptoms worsen or if the condition fails to improve as anticipated.   Meds ordered this encounter  Medications  . buPROPion (WELLBUTRIN XL) 300 MG 24 hr tablet    Sig: Take 1 tablet (300 mg total) by mouth daily.    Dispense:   90 tablet    Refill:  0  . escitalopram (LEXAPRO) 10 MG tablet    Sig: Take 1 tablet (10 mg total) by mouth daily.    Dispense:  90 tablet    Refill:  0    Medications Discontinued During This Encounter  Medication Reason  . 0.9 %  sodium chloride infusion   . buPROPion (WELLBUTRIN XL) 300 MG 24 hr tablet Reorder  . escitalopram (LEXAPRO) 10 MG tablet Reorder     Gross side effects, risk and benefits, and alternatives of medications and treatment plan in general discussed with patient.  Patient is aware that all medications have potential side effects and we are unable to predict every side effect or drug-drug interaction that may occur.   Patient was strongly encouraged to call with any questions or concerns they may have concerns.  Expresses verbal understanding and consents to current therapy and treatment regimen.  No barriers to understanding were identified.  Red flag symptoms and signs discussed in detail.  Patient expressed understanding regarding what to do in case of emergency\urgent symptoms  Please see AVS handed out to patient at the end of our visit for further patient instructions/ counseling done pertaining to today's office visit.  I provided 15 minutes of non-face-to-face time during this encounter.     Thomasene Lot, DO

## 2019-07-14 ENCOUNTER — Telehealth: Payer: Self-pay | Admitting: Family Medicine

## 2019-07-14 ENCOUNTER — Other Ambulatory Visit: Payer: Self-pay

## 2019-07-14 DIAGNOSIS — Z20822 Contact with and (suspected) exposure to covid-19: Secondary | ICD-10-CM

## 2019-07-14 NOTE — Telephone Encounter (Signed)
Stephanie Chan informed pt that she would need to go to [facility] to obtain rapid test.  Charyl Bigger, CMA

## 2019-07-14 NOTE — Telephone Encounter (Signed)
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 --Advised patient Provider/ medical staff will contact [facility] testing site to se to up Appt & one of [place] will call her back w/ instructions @ [phone].  --glh

## 2019-07-16 LAB — NOVEL CORONAVIRUS, NAA: SARS-CoV-2, NAA: NOT DETECTED

## 2019-09-24 ENCOUNTER — Encounter: Payer: Self-pay | Admitting: Family Medicine

## 2019-09-28 ENCOUNTER — Other Ambulatory Visit: Payer: Self-pay

## 2019-09-28 ENCOUNTER — Encounter: Payer: Self-pay | Admitting: Family Medicine

## 2019-09-28 ENCOUNTER — Ambulatory Visit (INDEPENDENT_AMBULATORY_CARE_PROVIDER_SITE_OTHER): Payer: 59 | Admitting: Family Medicine

## 2019-09-28 DIAGNOSIS — G479 Sleep disorder, unspecified: Secondary | ICD-10-CM

## 2019-09-28 DIAGNOSIS — F4323 Adjustment disorder with mixed anxiety and depressed mood: Secondary | ICD-10-CM

## 2019-09-28 DIAGNOSIS — F410 Panic disorder [episodic paroxysmal anxiety] without agoraphobia: Secondary | ICD-10-CM | POA: Diagnosis not present

## 2019-09-28 NOTE — Progress Notes (Signed)
Telehealth office visit note for Stephanie Chan, D.O- at Primary Care at [facility]   I connected with current patient today and verified that I am speaking with the correct person using two identifiers.   . Location of the patient: Home . Location of the provider: Office Only the patient (+/- their family members at pt's discretion) and myself were participating in the encounter - This visit type was conducted due to national recommendations for restrictions regarding the COVID-19 Pandemic (e.g. social distancing) in an effort to limit this patient's exposure and mitigate transmission in our community.  This format is felt to be most appropriate for this patient at this time.   - The patient did not have access to video technology or had technical difficulties with video requiring transitioning to audio format only. - No physical exam could be performed with this format, beyond that communicated to [place] by the patient/ family members as noted.   - Additionally my office staff/ schedulers discussed with the patient that there may be a monetary charge related to this service, depending on their medical insurance.   The patient expressed understanding, and agreed to proceed.       History of Present Illness:  I, Stephanie Chan, am serving as scribe for Dr. Mellody Chan.   Notes doing good today.  - Recent Blood Work w/ Abnormal Result Recently got some blood work and notes "I have donated blood before and never got anything [abnormal] back.  When this [recent result] came back, I was concerned because I didn't know what it was, what it meant if anything, and should I get it retested."  Says she wasn't sure if a mistake was made; "my main concern in calling you was just saying 'what do you think, what do you know?'"  States gave blood in July (06/02/2019) to the exact same people, and never got a letter; then gave blood in September, (08/15/2019) and got a letter.  Notes "I  hadn't given blood that much, but I had a friend with a daughter organizing all these blood things, so I just decided to help out."  Says she has been tested for HIV/ Hep C before in the past.  "I don't know if I've been tested since I've been there, but I know I've been tested before."  Says "after my third child, I did the whole big blood workup."  Notes "it has been a little while, but I have been tested before."  Her husband is her only sexual partner; says "the only needle that's been in my arm is to get blood."  Denies new concerns/symptoms; "not that I know of."  Says "I've been working out more, so my legs ache, and I might have a little cough and my chest hurts a little bit, but I don't know if those are symptoms of it."   - Mood Notes mood "was fine until I got that letter."  Says "it was a little sad because I just didn't know what it meant."  Notes doing well on Wellbutrin, Lexapro, etc.  "That really helps me, and I notice if I forget to take it for a day or two."  Notes "that combo seems to help."  States she rarely has to take a Xanax; continues on bottle prescribed back around March 2020.  She continues on fish oil and vitamins.  Says sometimes forgets to take baby aspirin.  Rarely takes tylenol or NSAID's.    GAD 7 :  Generalized Anxiety Score 10/06/2018 08/09/2017 04/09/2017  Nervous, Anxious, on Edge 1 1 1   Control/stop worrying 0 0 1  Worry too much - different things 1 1 1   Trouble relaxing 1 1 1   Restless 1 1 1   Easily annoyed or irritable 1 0 1  Afraid - awful might happen 1 0 0  Total GAD 7 Score 6 4 6   Anxiety Difficulty Somewhat difficult Somewhat difficult Somewhat difficult    [facility] 09/28/2019 10/06/2018 10/22/2017 08/09/2017 04/09/2017  Decreased Interest 1 1 1  0 1  Down, Depressed, Hopeless 1 1 1 1 1   PHQ - 2 Score 2 2 2 1 2   Altered sleeping 1 1 1 1 2   Tired, decreased energy 1 1 1 1  0  Change in appetite 0 1 1 0 2  Feeling bad or  failure about yourself  0 2 1 1 2   Trouble concentrating 1 0 0 1 0  Moving slowly or fidgety/restless 0 1 1 1 1   Suicidal thoughts 0 0 0 0 0  PHQ-9 Score 5 8 7 6 9   Difficult doing work/chores Not difficult at all Somewhat difficult Somewhat difficult Somewhat difficult -      Impression and Recommendations:    1. Adjustment disorder with mixed anxiety and depressed mood   2. Panic anxiety syndrome   3. Sleeping difficulties      Recent Abnormal Result from Giving Blood  - Patient recently gave blood on 08/17/2019.   - Patient was O+ and screening test performed.  Results of blood screening per photo record: Hepatitis B surface antigen negative, non-reactive. Hepatitis B core antibody negative. Hepatitis C virus antibiody non-reactive/negative. Multiplex HIV NAT, HBV NAT, HCV NAT all non-reactive. HIV 1 & 2 antibody non-reactive. Serological test for syphilis (STS) non-reactive. West Nile Virus non-reactive. Zika Virus non-reactive. Human T-Cell lymphotrophic Virus I / II antibody is reactive. Supplemental indeterminate.  - Advised patient regarding concerns during appointment today. - Conversation held and education provided. - Discussed I will need to consult with further specialists re: next steps.    - Told pt I will reach out to my Hem/ ONC and possibly ID providers for further direction.  - told her I will reach back out to her ONCE I hear something from them.  - Reviewed options with patient today. - Advised that she may be referred to hematology/oncology for further evaluation. - Discussed patient may also follow up with infectious disease - message sent to my Hem-Onc colleagues Dr. Pamelia HoitGudena and Dr. Darnelle CatalanMagrinat - Per patient, will obtain blood work in near future PRN as advised. - May reach out to Dr Orvan Falconerampbell or [facility] - Once advice is obtained from specialist as discussed, will let patient know.   Adjustment Disorder w/ mixed Anxiety & Depressed  Mood, Panic Anxiety Syndrome, Sleep - Stable at this time on current management. - No changes made to treatment plan today. - Continue management as established.  See med list.  - Encouraged patient to continue taking Xanax rarely, for panic ONLY.  - Will continue to monitor.   Recommendations - Advised that patient may obtain flu shot PRN at clinic or other site as advised.  - As part of my medical decision making, I reviewed the following data within the electronic MEDICAL RECORD NUMBER History obtained from pt /family, CMA notes reviewed and incorporated if applicable, Labs reviewed, Radiograph/ tests reviewed if applicable and OV notes from prior OV's with me, as  well as other specialists she/he has seen since seeing me last, were all reviewed and used in my medical decision making process today.    - Additionally, discussion had with patient regarding our treatment plan, and their biases/concerns about that plan were used in my medical decision making today.    - The patient agreed with the plan and demonstrated an understanding of the instructions.   No barriers to understanding were identified.    - Red flag symptoms and signs discussed in detail.  Patient expressed understanding regarding what to do in case of emergency\ urgent symptoms.   - The patient was advised to call back or seek an in-person evaluation if the symptoms worsen or if the condition fails to improve as anticipated.   Return for will need CPE/ yrly physical ( come fasting;  next available) .     Medications Discontinued During This Encounter  Medication Reason  . Cyanocobalamin (B-12) 1000 MCG TABS Change in therapy  . fexofenadine-pseudoephedrine (ALLEGRA-D) 60-120 MG 12 hr tablet Change in therapy     I provided 15 minutes of non face-to-face time during this encounter.  Additional time was spent with charting and coordination of care after the actual visit commenced.   Note:  This note was prepared with  assistance of Dragon voice recognition software. Occasional wrong-word or sound-a-like substitutions may have occurred due to the inherent limitations of voice recognition software.  This document serves as a record of services personally performed by Thomasene Lot, DO. It was created on her behalf by Peggye Fothergill, a trained medical scribe. The creation of this record is based on the scribe's personal observations and the provider's statements to them.   This case required medical decision making of at least moderate complexity. The above documentation has been reviewed to be accurate and was completed by Carlye Grippe, D.O.       Patient Care Team    Relationship Specialty Notifications Start End  Thomasene Lot, DO PCP - General Family Medicine  08/08/16      -Vitals obtained; medications/ allergies reconciled;  personal medical, social, Sx etc.histories were updated by CMA, reviewed by me and are reflected in chart   Patient Active Problem List   Diagnosis Date Noted  . Sleeping difficulties 02/12/2019  . Panic anxiety syndrome 10/06/2018  . Olecranon bursitis of right elbow 10/06/2018  . Personal history of noncompliance with medical treatment and regimen 10/06/2018  . Encounter for wellness examination 10/22/2017  . Counseling on health promotion and disease prevention 10/27/2016  . Vitamin D insufficiency 10/05/2016  . Perimenopausal hot flashes.  08/08/2016  . BMI 30.0-30.9,adult 08/08/2016  . Depression 08/08/2016  . Adjustment disorder with mixed anxiety and depressed mood 08/08/2016  . Environmental and seasonal allergies 08/08/2016  . History of migraines 08/08/2016     Current Meds  Medication Sig  . ALPRAZolam (XANAX) 0.5 MG tablet Take 0.5-1 tablets (0.25-0.5 mg total) by mouth as needed for anxiety (panic attack). anxiety  . aspirin EC 81 MG tablet Take 81 mg by mouth daily.  . B Complex-Biotin-FA (B-COMPLEX PO) Take 1 capsule by mouth daily.   Marland Kitchen buPROPion (WELLBUTRIN XL) 300 MG 24 hr tablet Take 1 tablet (300 mg total) by mouth daily.  . cetirizine-pseudoephedrine (ZYRTEC-D) 5-120 MG per tablet Take 1 tablet by mouth daily as needed for allergies.   . Cholecalciferol (VITAMIN D3) 5000 units CAPS Take 1 capsule (5,000 Units total) by mouth daily.  Marland Kitchen escitalopram (LEXAPRO) 10 MG tablet Take  1 tablet (10 mg total) by mouth daily.  . Multiple Vitamin (MULTIVITAMIN) tablet Take 1 tablet by mouth daily.  . Omega-3 Fatty Acids (FISH OIL) 1000 MG CAPS Take 1 capsule by mouth.     Allergies:  No Known Allergies   ROS:  See above HPI for pertinent positives and negatives   Objective:   There were no vitals taken for this visit.  (if some vitals are omitted, this means that patient was UNABLE to obtain them even though they were asked to get them prior to OV today.  They were asked to call us at their earliest convenience with these once obtained. )  General: A & O * 3; sounds in no acute distress; in usual state of health.  Skin: Pt confirms warm and dry extremities and pink fingertips HEENT: Pt confirms lips non-cyanotic Chest: Patient confirms normal chest excursion and movement Respiratory: speaking in full sentences, no conversational dyspnea; patient confirms no use of accessory muscles Psych: insight appears good, mood- appears full

## 2019-10-05 ENCOUNTER — Encounter: Payer: Self-pay | Admitting: Family Medicine

## 2019-10-05 NOTE — Telephone Encounter (Signed)
Patient is aware that the referral has been placed to a hematologist and is awaiting to hear from them. AS, CMA

## 2019-10-05 NOTE — Telephone Encounter (Signed)
----- Message from Thomasene Lot, DO sent at 10/05/2019  1:45 PM EST ----- Regarding: RE: quick q Sure that sounds great.  Thank you so much for your reply Jonny Ruiz.    I will place a referral for her to be seen by you in your office.   My best, Deb   PS.  The patient did scan in a copy of her results from the blood donation center- [facility].  Thank you so much.   To my CMA Gayle Collard, please call patient and let her know it is best if she is seen and evaluated by the specialist so that all of her questions can be answered regarding this possible lab abnormality.    Please place a referral to Dr. Cliffton Asters of infectious disease for patient to be seen and evaluated for her "abnormal blood work" (positive for HTLV I-II, with results quantified as "indeterminate ")      ----- Message ----- From: Cliffton Asters, MD Sent: 10/05/2019   8:20 AM EST To: Thomasene Lot, DO Subject: RE: quick q                                    Deb,Without seeing the exact results I cannot be certain but an indeterminate result usually represents a negative result and does not need any further follow-up.  However, we would be happy to see her if she is concerned and wants [place] to review the case in person.John ----- Message ----- From: Thomasene Lot, DO Sent: 09/29/2019  12:34 PM EST To: Cliffton Asters, MD Subject: FW: quick q                                    Afternoon Dr. Orvan Falconer.  This is Deb Opalski.  I am a primary care physician down at [facility] and I have a quick question that my hematology friends recommend I contact you about.  Dr. Orvan Falconer,  I have a real quick question.  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       I have actually never had a patient asked me about this in the past.    What work-up would you recommend and /or is this something I would  need to send to your clinic for further education and evaluation?  Thank you so much for your guidance and expertise.    -Deb    ----- Message ----- From: Lowella Dell, MD Sent: 09/29/2019   7:59 AM EST To: Thomasene Lot, DO, Serena Croissant, MD Subject: RE: quick q                                    My (imperfect) understanding is that if a screening test is positive you have to do more specific testing (same as with HIV), generally a Western Blot. Agree w Dr Pamelia Hoit that a call to Dr Ilsa Iha or Orvan Falconer in ID will be more helpful than my comments  If the patient does have HTLV/I-II she is at risk for a type of leukemia-lymphoma and referral here would then be appropriate  Hope this helps!  GM ----- Message ----- From: Thomasene Lot, DO  Sent: 09/28/2019   3:20 PM EST To: Chauncey Cruel, MD, Nicholas Lose, MD Subject: quick q                                        I know we share several mutual patients and I need a quick curbside consult.  Had a 53 year old healthy patient who saw me today regarding questions on her recent "blood one" screening tests when she recently donated.  All of the screenings were negative except for her anti-HTLV-I/II; which said "reactive supplemental indeterminate ".  I apologize as I could not find out much information regarding this and further screening tests if needed.  I am wondering #1 ) what I need to tell the patient and #2 ) if there is anything else I need to do/additional labs etc.    Thank you so much for your expertise, guidance and quick explanation.  [place], [place] [facility]  P.s. If either of you would like to see her I am happy to send a referral if you feel that is necessary.

## 2019-10-28 ENCOUNTER — Other Ambulatory Visit: Payer: Self-pay | Admitting: Family Medicine

## 2019-10-28 DIAGNOSIS — F4323 Adjustment disorder with mixed anxiety and depressed mood: Secondary | ICD-10-CM

## 2019-10-28 DIAGNOSIS — F32A Depression, unspecified: Secondary | ICD-10-CM

## 2019-10-28 DIAGNOSIS — F329 Major depressive disorder, single episode, unspecified: Secondary | ICD-10-CM

## 2020-01-15 ENCOUNTER — Other Ambulatory Visit: Payer: Self-pay | Admitting: Family Medicine

## 2020-01-15 DIAGNOSIS — F4323 Adjustment disorder with mixed anxiety and depressed mood: Secondary | ICD-10-CM

## 2020-01-15 DIAGNOSIS — F329 Major depressive disorder, single episode, unspecified: Secondary | ICD-10-CM

## 2020-01-15 DIAGNOSIS — F32A Depression, unspecified: Secondary | ICD-10-CM

## 2020-02-24 ENCOUNTER — Other Ambulatory Visit: Payer: Self-pay | Admitting: Family Medicine

## 2020-02-24 DIAGNOSIS — F32A Depression, unspecified: Secondary | ICD-10-CM

## 2020-02-24 DIAGNOSIS — F329 Major depressive disorder, single episode, unspecified: Secondary | ICD-10-CM

## 2020-02-24 DIAGNOSIS — F4323 Adjustment disorder with mixed anxiety and depressed mood: Secondary | ICD-10-CM

## 2020-02-24 MED ORDER — ESCITALOPRAM OXALATE 10 MG PO TABS: 10.0000 mg | ORAL_TABLET | Freq: Every day | ORAL | 0 refills | Status: DC

## 2020-02-24 MED ORDER — BUPROPION HCL ER (XL) 300 MG PO TB24: 300.0000 mg | ORAL_TABLET | Freq: Every day | ORAL | 0 refills | Status: DC

## 2020-02-24 NOTE — Telephone Encounter (Signed)
Patient called states she needs a refill on :   ----(Pt is aware OV /Appt required for Rx refills & scheduled OV for 03/10/20--glh)  buPROPion (WELLBUTRIN XL) 300 MG 24 hr tablet [[id number]]   Order Details Dose: 300 mg Route: Oral Frequency: Daily  Dispense Quantity: 30 tablet Refills: 0       Sig: Take 1 tablet (300 mg total) by mouth daily. **PATIENT NEEDS APT FOR FURTHER REFILLS**       &   escitalopram (LEXAPRO) 10 MG tablet [[id number]]   Order Details Dose: 10 mg Route: Oral Frequency: Daily  Dispense Quantity: 30 tablet Refills: 0       Sig: Take 1 tablet (10 mg total) by mouth daily. **PATIENT NEEDS APT FOR FURTHER REFILLS**   --Pt ask refill order to be sent to :   [facility] - [place], [place] - [address] [phone] (Phone) [phone] (Fax)   ---- Also patientt scheduled appt for 4/22   --glh

## 2020-02-24 NOTE — Telephone Encounter (Signed)
#  15 day supply of both meds sent to pharmacy per refill protocol. AS, CMA

## 2020-03-10 ENCOUNTER — Other Ambulatory Visit: Payer: Self-pay

## 2020-03-10 ENCOUNTER — Ambulatory Visit (INDEPENDENT_AMBULATORY_CARE_PROVIDER_SITE_OTHER): Payer: 59 | Admitting: Physician Assistant

## 2020-03-10 ENCOUNTER — Encounter: Payer: Self-pay | Admitting: Physician Assistant

## 2020-03-10 VITALS — BP 115/76 | HR 79 | Temp 98.1°F | Ht 66.0 in | Wt 203.5 lb

## 2020-03-10 DIAGNOSIS — F410 Panic disorder [episodic paroxysmal anxiety] without agoraphobia: Secondary | ICD-10-CM

## 2020-03-10 DIAGNOSIS — S99921A Unspecified injury of right foot, initial encounter: Secondary | ICD-10-CM

## 2020-03-10 DIAGNOSIS — F4323 Adjustment disorder with mixed anxiety and depressed mood: Secondary | ICD-10-CM

## 2020-03-10 DIAGNOSIS — F32A Depression, unspecified: Secondary | ICD-10-CM

## 2020-03-10 DIAGNOSIS — F329 Major depressive disorder, single episode, unspecified: Secondary | ICD-10-CM

## 2020-03-10 MED ORDER — ALPRAZOLAM 0.5 MG PO TABS: 0.2500 mg | ORAL_TABLET | ORAL | 0 refills | Status: DC | PRN

## 2020-03-10 MED ORDER — BUPROPION HCL ER (XL) 300 MG PO TB24: 300.0000 mg | ORAL_TABLET | Freq: Every day | ORAL | 1 refills | Status: DC

## 2020-03-10 MED ORDER — ESCITALOPRAM OXALATE 10 MG PO TABS: 10.0000 mg | ORAL_TABLET | Freq: Every day | ORAL | 1 refills | Status: DC

## 2020-03-10 NOTE — Patient Instructions (Signed)

## 2020-03-10 NOTE — Progress Notes (Signed)
Established Patient Office Visit  Subjective:  Patient ID: Stephanie Chan, female    DOB: 12/20/1965  Age: 54 y.o. MRN: [id number]  CC:  Chief Complaint  Patient presents with  . Anxiety    HPI Stephanie Chan presents for mood management. Patient reports she's been doing well with the exception of some family issues with her daughter, which has created extra stress and anxiety. States Wellbutrin and Lexapro really help keep her mood stable. Reports she rarely uses Xanax for panic attacks. Also, 1 month ago she had an injury to right second toe. Her labrador dog accidentally dropped a bowling pin on her toe and she never went to get it evaluated. She feels like it probably broke but seems to be getting better. States swelling and pain have significantly improved.   Past Medical History:  Diagnosis Date  . Allergy   . Bundle branch block   . Depression   . Migraines   . Panic attack   . Vertigo     Past Surgical History:  Procedure Laterality Date  . CESAREAN SECTION    . WISDOM TOOTH EXTRACTION      Family History  Problem Relation Age of Onset  . Thyroid disease Mother 44  . Kidney disease Father 64  . Heart disease Father 73  . Healthy Sister   . Healthy Brother   . Healthy Daughter   . Healthy Sister   . Healthy Sister   . Healthy Daughter   . Healthy Daughter   . Colon cancer Neg Hx   . Colon polyps Neg Hx   . Rectal cancer Neg Hx   . Stomach cancer Neg Hx     Social History   Socioeconomic History  . Marital status: Married    Spouse name: Not on file  . Number of children: Not on file  . Years of education: Not on file  . Highest education level: Not on file  Occupational History  . Not on file  Tobacco Use  . Smoking status: Never Smoker  . Smokeless tobacco: Never Used  Substance and Sexual Activity  . Alcohol use: Yes    Alcohol/week: 2.0 standard drinks    Types: 2 Glasses of wine per week    Comment: socially  . Drug use: No  .  Sexual activity: Yes    Birth control/protection: Surgical  Other Topics Concern  . Not on file  Social History Narrative  . Not on file   Social Determinants of Health   Financial Resource Strain:   . Difficulty of Paying Living Expenses:   Food Insecurity:   . Worried About Charity fundraiser in the Last Year:   . Arboriculturist in the Last Year:   Transportation Needs:   . Film/video editor (Medical):   Marland Kitchen Lack of Transportation (Non-Medical):   Physical Activity:   . Days of Exercise per Week:   . Minutes of Exercise per Session:   Stress:   . Feeling of Stress :   Social Connections:   . Frequency of Communication with Friends and Family:   . Frequency of Social Gatherings with Friends and Family:   . Attends Religious Services:   . Active Member of Clubs or Organizations:   . Attends Archivist Meetings:   Marland Kitchen Marital Status:   Intimate Partner Violence:   . Fear of Current or Ex-Partner:   . Emotionally Abused:   Marland Kitchen Physically Abused:   . Sexually  Abused:     Outpatient Medications Prior to Visit  Medication Sig Dispense Refill  . acetaminophen (TYLENOL) 500 MG tablet Take 1,000 mg by mouth every 6 (six) hours as needed for moderate pain or headache. Pain    . aspirin EC 81 MG tablet Take 81 mg by mouth daily.    . B Complex-Biotin-FA (B-COMPLEX PO) Take 1 capsule by mouth daily.    . cetirizine-pseudoephedrine (ZYRTEC-D) 5-120 MG per tablet Take 1 tablet by mouth daily as needed for allergies.     . Cholecalciferol (VITAMIN D3) 5000 units CAPS Take 1 capsule (5,000 Units total) by mouth daily. 30 capsule   . ibuprofen (ADVIL,MOTRIN) 200 MG tablet Take 400 mg by mouth every 6 (six) hours as needed. Pain    . Multiple Vitamin (MULTIVITAMIN) tablet Take 1 tablet by mouth daily.    . Omega-3 Fatty Acids (FISH OIL) 1000 MG CAPS Take 1 capsule by mouth.    . ALPRAZolam (XANAX) 0.5 MG tablet Take 0.5-1 tablets (0.25-0.5 mg total) by mouth as needed for  anxiety (panic attack). anxiety 30 tablet 0  . buPROPion (WELLBUTRIN XL) 300 MG 24 hr tablet Take 1 tablet (300 mg total) by mouth daily. **PATIENT NEEDS APT FOR FURTHER REFILLS** 15 tablet 0  . escitalopram (LEXAPRO) 10 MG tablet Take 1 tablet (10 mg total) by mouth daily. **PATIENT NEEDS APT FOR FURTHER REFILLS** 15 tablet 0   No facility-administered medications prior to visit.    No Known Allergies  ROS Review of Systems  Review of Systems: General: Denies fever, chills, unexplained weight loss.  Optho/Auditory: Denies visual changes, blurred vision/LOV Respiratory: Denies SOB, DOE more than baseline levels.  Cardiovascular: Denies chest pain, palpitations, new onset peripheral edema  Gastrointestinal: Denies nausea, vomiting, diarrhea.  Genitourinary: Denies dysuria, freq/ urgency, flank pain or discharge from genitals.  Endocrine:  Denies hot or cold intolerance, polyuria, polydipsia. Musculoskeletal:   Denies unexplained myalgia, unexplained arthralgias, gait problems.  Skin:  Denies rash, suspicious lesions Neurological:   Denies dizziness, unexplained weakness, numbness  Psychiatric/Behavioral: Denies suicidal or homicidal ideations, hallucinations     Objective:    Physical Exam General:  Well Developed, well nourished, appropriate for stated age.  Neuro:  Alert and oriented,  extra-ocular muscles intact  HEENT:  Normocephalic, atraumatic, neck supple, no carotid bruits appreciated  Skin:  no gross rash, warm and dry. Cardiac:  RRR, S1 S2 Respiratory:  ECTA B/L and A/P, Not using accessory muscles, speaking in full sentences- unlabored. MSK: Right 2nd toe mildly tender and swollen, minimal bruising noted. No sensory-motor deficits.  Vascular:  Ext warm, no cyanosis apprec.; cap RF less 2 sec. Psych:  No HI/SI, judgement and insight good, Euthymic mood. Full Affect.   BP 115/76   Pulse 79   Temp 98.1 F (36.7 C) (Oral)   Ht 5\' 6"  (1.676 m)   Wt 203 lb 8 oz  (92.3 kg)   SpO2 100% Comment: on RA  BMI 32.85 kg/m  Wt Readings from Last 3 Encounters:  03/10/20 203 lb 8 oz (92.3 kg)  10/06/18 197 lb (89.4 kg)  12/18/17 193 lb (87.5 kg)     Health Maintenance Due  Topic Date Due  . HIV Screening  Never done  . COVID-19 Vaccine (1) Never done  . MAMMOGRAM  Never done  . PAP SMEAR-Modifier  04/06/2019    There are no preventive care reminders to display for this patient.  Lab Results  Component Value Date   TSH 0.640  10/18/2017   Lab Results  Component Value Date   WBC 3.8 10/18/2017   HGB 12.9 10/18/2017   HCT 36.9 10/18/2017   MCV 86 10/18/2017   PLT 178 10/18/2017   Lab Results  Component Value Date   NA 144 10/18/2017   K 4.4 10/18/2017   CO2 24 10/18/2017   GLUCOSE 88 10/18/2017   BUN 21 10/18/2017   CREATININE 0.67 10/18/2017   BILITOT 0.4 10/18/2017   ALKPHOS 91 10/18/2017   AST 13 10/18/2017   ALT 14 10/18/2017   PROT 6.8 10/18/2017   ALBUMIN 4.6 10/18/2017   CALCIUM 8.9 10/18/2017   Lab Results  Component Value Date   CHOL 190 10/18/2017   Lab Results  Component Value Date   HDL 55 10/18/2017   Lab Results  Component Value Date   LDLCALC 121 (H) 10/18/2017   Lab Results  Component Value Date   TRIG 69 10/18/2017   Lab Results  Component Value Date   CHOLHDL 3.5 10/18/2017   Lab Results  Component Value Date   HGBA1C 5.1 10/18/2017      Assessment & Plan:   Problem List Items Addressed This Visit      Other   Depression (Chronic)   Relevant Medications   ALPRAZolam (XANAX) 0.5 MG tablet   buPROPion (WELLBUTRIN XL) 300 MG 24 hr tablet   escitalopram (LEXAPRO) 10 MG tablet   Adjustment disorder with mixed anxiety and depressed mood - Primary (Chronic)   Relevant Medications   buPROPion (WELLBUTRIN XL) 300 MG 24 hr tablet   escitalopram (LEXAPRO) 10 MG tablet   Panic anxiety syndrome   Relevant Medications   ALPRAZolam (XANAX) 0.5 MG tablet   buPROPion (WELLBUTRIN XL) 300 MG 24 hr  tablet   escitalopram (LEXAPRO) 10 MG tablet    Other Visit Diagnoses    Injury of right toe, initial encounter          Meds ordered this encounter  Medications  . ALPRAZolam (XANAX) 0.5 MG tablet    Sig: Take 0.5-1 tablets (0.25-0.5 mg total) by mouth as needed for anxiety (panic attack). anxiety    Dispense:  30 tablet    Refill:  0  . buPROPion (WELLBUTRIN XL) 300 MG 24 hr tablet    Sig: Take 1 tablet (300 mg total) by mouth daily.    Dispense:  90 tablet    Refill:  1  . escitalopram (LEXAPRO) 10 MG tablet    Sig: Take 1 tablet (10 mg total) by mouth daily.    Dispense:  90 tablet    Refill:  1   Mood, Adjustment disorder w/ mixed anxiety & depression : - Mood is stable. GAD score of 5 and PHQ9 score of 6. - Continue current regimen. - Will continue to monitor.  Panic Anxiety Sydnrome - Pt reports no recent panic attacks. - Pt requested refill of Xanax. - Checked PDMP, no aberrancies noted. - Discussed to use Xanax only as needed for panic attacks.  Right toe injury - Injury to second right toe happened 1 month ago and pt never had it evaluated. Discussed ordering x-ray to evaluate for possible fracture. Pt declined at this time given it's getting better. No obvious deformity, hematoma, or open wounds noted.   - Discussed to monitor for any worsening signs and symptoms such as increased swelling, redness, warmth, fever, worsening pain, or unable to ambulate. Return to clinic if worsening or fails to improve, and will order x-ray to evaluate for possible  fracture/nonunion.   Follow-up: Return for CPE and FBW in 4-6 months.    Mayer Masker, PA-C

## 2020-09-12 ENCOUNTER — Other Ambulatory Visit: Payer: Self-pay

## 2020-09-12 ENCOUNTER — Other Ambulatory Visit: Payer: 59

## 2020-09-12 DIAGNOSIS — E559 Vitamin D deficiency, unspecified: Secondary | ICD-10-CM

## 2020-09-12 DIAGNOSIS — Z0000 Encounter for general adult medical examination without abnormal findings: Secondary | ICD-10-CM

## 2020-09-13 LAB — COMPREHENSIVE METABOLIC PANEL
ALT: 17 IU/L (ref 0–32)
AST: 16 IU/L (ref 0–40)
Albumin/Globulin Ratio: 1.6 (ref 1.2–2.2)
Albumin: 4.3 g/dL (ref 3.8–4.9)
Alkaline Phosphatase: 101 IU/L (ref 44–121)
BUN/Creatinine Ratio: 22 (ref 9–23)
BUN: 17 mg/dL (ref 6–24)
Bilirubin Total: 0.2 mg/dL (ref 0.0–1.2)
CO2: 23 mmol/L (ref 20–29)
Calcium: 8.7 mg/dL (ref 8.7–10.2)
Chloride: 106 mmol/L (ref 96–106)
Creatinine, Ser: 0.77 mg/dL (ref 0.57–1.00)
GFR calc Af Amer: 103 mL/min/{1.73_m2} (ref >59)
GFR calc non Af Amer: 89 mL/min/{1.73_m2} (ref >59)
Globulin, Total: 2.7 g/dL (ref 1.5–4.5)
Glucose: 88 mg/dL (ref 65–99)
Potassium: 4 mmol/L (ref 3.5–5.2)
Sodium: 142 mmol/L (ref 134–144)
Total Protein: 7 g/dL (ref 6.0–8.5)

## 2020-09-13 LAB — CBC WITH DIFFERENTIAL/PLATELET
Basophils Absolute: 0 10*3/uL (ref 0.0–0.2)
Basos: 1 %
EOS (ABSOLUTE): 0.1 10*3/uL (ref 0.0–0.4)
Eos: 4 %
Hematocrit: 36.5 % (ref 34.0–46.6)
Hemoglobin: 12.2 g/dL (ref 11.1–15.9)
Immature Grans (Abs): 0 10*3/uL (ref 0.0–0.1)
Immature Granulocytes: 0 %
Lymphocytes Absolute: 1.6 10*3/uL (ref 0.7–3.1)
Lymphs: 40 %
MCH: 29.8 pg (ref 26.6–33.0)
MCHC: 33.4 g/dL (ref 31.5–35.7)
MCV: 89 fL (ref 79–97)
Monocytes Absolute: 0.3 10*3/uL (ref 0.1–0.9)
Monocytes: 7 %
Neutrophils Absolute: 2 10*3/uL (ref 1.4–7.0)
Neutrophils: 48 %
Platelets: 191 10*3/uL (ref 150–450)
RBC: 4.09 x10E6/uL (ref 3.77–5.28)
RDW: 12.4 % (ref 11.7–15.4)
WBC: 4 10*3/uL (ref 3.4–10.8)

## 2020-09-13 LAB — VITAMIN D 25 HYDROXY (VIT D DEFICIENCY, FRACTURES): Vit D, 25-Hydroxy: 56.4 ng/mL (ref 30.0–100.0)

## 2020-09-13 LAB — HEMOGLOBIN A1C
Est. average glucose Bld gHb Est-mCnc: 100 mg/dL
Hgb A1c MFr Bld: 5.1 % (ref 4.8–5.6)

## 2020-09-13 LAB — LIPID PANEL
Chol/HDL Ratio: 3.4 ratio (ref 0.0–4.4)
Cholesterol, Total: 174 mg/dL (ref 100–199)
HDL: 51 mg/dL (ref >39)
LDL Chol Calc (NIH): 105 mg/dL — ABNORMAL HIGH (ref 0–99)
Triglycerides: 100 mg/dL (ref 0–149)
VLDL Cholesterol Cal: 18 mg/dL (ref 5–40)

## 2020-09-13 LAB — TSH: TSH: 0.853 u[IU]/mL (ref 0.450–4.500)

## 2020-09-19 ENCOUNTER — Encounter: Payer: Self-pay | Admitting: Physician Assistant

## 2020-09-19 ENCOUNTER — Other Ambulatory Visit (HOSPITAL_COMMUNITY)
Admission: RE | Admit: 2020-09-19 | Discharge: 2020-09-19 | Disposition: A | Discharge status: Home / Self Care | Payer: 59 | Source: Ambulatory Visit | Attending: Physician Assistant | Admitting: Physician Assistant

## 2020-09-19 ENCOUNTER — Other Ambulatory Visit: Payer: Self-pay

## 2020-09-19 ENCOUNTER — Ambulatory Visit (INDEPENDENT_AMBULATORY_CARE_PROVIDER_SITE_OTHER): Payer: 59 | Admitting: Physician Assistant

## 2020-09-19 VITALS — BP 113/74 | HR 72 | Ht 66.0 in | Wt 199.1 lb

## 2020-09-19 DIAGNOSIS — Z124 Encounter for screening for malignant neoplasm of cervix: Secondary | ICD-10-CM | POA: Diagnosis not present

## 2020-09-19 DIAGNOSIS — Z2821 Immunization not carried out because of patient refusal: Secondary | ICD-10-CM

## 2020-09-19 DIAGNOSIS — Z0000 Encounter for general adult medical examination without abnormal findings: Secondary | ICD-10-CM | POA: Diagnosis not present

## 2020-09-19 DIAGNOSIS — F4323 Adjustment disorder with mixed anxiety and depressed mood: Secondary | ICD-10-CM

## 2020-09-19 DIAGNOSIS — Z1231 Encounter for screening mammogram for malignant neoplasm of breast: Secondary | ICD-10-CM

## 2020-09-19 NOTE — Progress Notes (Signed)
Female Physical   Impression and Recommendations:    1. Healthcare maintenance   2. Encounter for screening mammogram for malignant neoplasm of breast   3. Screening for cervical cancer   4. Adjustment disorder with mixed anxiety and depressed mood   5. Influenza vaccination declined      1) Anticipatory Guidance: Discussed skin CA prevention and sunscreen when outside along with skin surveillance; eating a balanced and modest diet; physical activity at least 25 minutes per day or minimum of 150 min/ week moderate to intense activity.  2) Immunizations / Screenings / Labs:   All immunizations are up-to-date per recommendations or will be updated today if pt allows.    - Patient understands with dental and vision screens they will schedule independently.  - Obtained CBC, CMP, HgA1c, Lipid panel, TSH and vit D when fasting.  Most labs are essentially within normal limits or stable from prior.  LDL mildly elevated. - UTD on colonoscopy, Tdap - Declined Shingrix and influenza vaccines at this time.  3) Weight: Discussed goal to improve diet habits to improve overall feelings of well being and objective health data. Improve nutrient density of diet through increasing intake of fruits and vegetables and decreasing saturated fats, white flour products and refined sugars.  4) Healthcare Maintenance:  -Continue current medication regimen. -Encouraged patient to get established with [facility] therapist and if needs a referral to let me know. -Follow a heart healthy diet and stay as active as possible. -Stay well-hydrated. -Will notify of Pap results once available. -Follow-up in 4 months for regular OV: Mood management  No orders of the defined types were placed in this encounter.   Orders Placed This Encounter  Procedures  . MM Digital Screening     Return in about 4 months (around 01/17/2021) for Mood.      Gross side effects, risk and benefits, and alternatives of medications  discussed with patient.  Patient is aware that all medications have potential side effects and we are unable to predict every side effect or drug-drug interaction that may occur.  Expresses verbal understanding and consents to current therapy plan and treatment regimen.  F-up preventative CPE in 1 year- reminded pt again, this is in addition to any chronic care visits.    Please see orders placed and AVS handed out to patient at the end of our visit for further patient instructions/ counseling done pertaining to today's office visit.  Note:  This note was prepared with assistance of Dragon voice recognition software. Occasional wrong-word or sound-a-like substitutions may have occurred due to the inherent limitations of voice recognition software.    Subjective:     CPE HPI: Stephanie Chan is a 54 y.o. female who presents to [facility] today for a yearly health maintenance exam.   Health Maintenance Summary  - Reviewed and updated, unless pt declines services.  Last Cologuard or Colonoscopy:   12/18/2017- repeat in 10 years Family history of Colon CA: N  Tobacco History Reviewed:  Y, never a smoker Alcohol and/or drug use:    No concerns; no use Dental Home: Y Eye exams: Y Dermatology home:Not recently  Female Health:  PAP Smear - last known results:  Collecting today. 04/05/2016- negative STD concerns:   none, Lumps or breast concerns:  none Breast Cancer Family History:  No    Additional concerns beyond health maintenance issues: none    Immunization History  Administered Date(s) Administered  . Influenza,inj,Quad PF,6+ Mos  10/22/2017, 10/06/2018  . Tdap 12/26/2012     Health Maintenance  Topic Date Due  . MAMMOGRAM  Never done  . PAP SMEAR-Modifier  04/06/2019  . COVID-19 Vaccine (1) 10/05/2020 (Originally 09/22/1979)  . INFLUENZA VACCINE  02/16/2021 (Originally 06/19/2020)  . Hepatitis C Screening  09/19/2021 (Originally  [birth year])  . HIV Screening  09/19/2021 (Originally 09/21/1982)  . TETANUS/TDAP  12/26/2022  . COLONOSCOPY  12/19/2027     Wt Readings from Last 3 Encounters:  09/19/20 199 lb 1.6 oz (90.3 kg)  03/10/20 203 lb 8 oz (92.3 kg)  10/06/18 197 lb (89.4 kg)   BP Readings from Last 3 Encounters:  09/19/20 113/74  03/10/20 115/76  10/06/18 107/73   Pulse Readings from Last 3 Encounters:  09/19/20 72  03/10/20 79  10/06/18 78     Past Medical History:  Diagnosis Date  . Allergy   . Bundle branch block   . Depression   . Migraines   . Panic attack   . Vertigo       Past Surgical History:  Procedure Laterality Date  . CESAREAN SECTION    . WISDOM TOOTH EXTRACTION        Family History  Problem Relation Age of Onset  . Thyroid disease Mother 65  . Kidney disease Father 70  . Heart disease Father 14  . Healthy Sister   . Healthy Brother   . Healthy Daughter   . Healthy Sister   . Healthy Sister   . Healthy Daughter   . Healthy Daughter   . Colon cancer Neg Hx   . Colon polyps Neg Hx   . Rectal cancer Neg Hx   . Stomach cancer Neg Hx       Social History   Substance and Sexual Activity  Drug Use No  ,   Social History   Substance and Sexual Activity  Alcohol Use Yes  . Alcohol/week: 2.0 standard drinks  . Types: 2 Glasses of wine per week   Comment: socially  ,   Social History   Tobacco Use  Smoking Status Never Smoker  Smokeless Tobacco Never Used  ,   Social History   Substance and Sexual Activity  Sexual Activity Yes  . Birth control/protection: Surgical    Current Outpatient Medications on File Prior to Visit  Medication Sig Dispense Refill  . acetaminophen (TYLENOL) 500 MG tablet Take 1,000 mg by mouth every 6 (six) hours as needed for moderate pain or headache. Pain    . ALPRAZolam (XANAX) 0.5 MG tablet Take 0.5-1 tablets (0.25-0.5 mg total) by mouth as needed for anxiety (panic attack). anxiety 30 tablet 0  . aspirin EC 81 MG  tablet Take 81 mg by mouth daily.    . B Complex-Biotin-FA (B-COMPLEX PO) Take 1 capsule by mouth daily.    Marland Kitchen buPROPion (WELLBUTRIN XL) 300 MG 24 hr tablet Take 1 tablet (300 mg total) by mouth daily. 90 tablet 1  . cetirizine-pseudoephedrine (ZYRTEC-D) 5-120 MG per tablet Take 1 tablet by mouth daily as needed for allergies.     . Cholecalciferol (VITAMIN D3) 5000 units CAPS Take 1 capsule (5,000 Units total) by mouth daily. 30 capsule   . escitalopram (LEXAPRO) 10 MG tablet Take 1 tablet (10 mg total) by mouth daily. 90 tablet 1  . ibuprofen (ADVIL,MOTRIN) 200 MG tablet Take 400 mg by mouth every 6 (six) hours as needed. Pain    . Multiple Vitamin (MULTIVITAMIN) tablet Take 1 tablet by mouth daily.    Marland Kitchen  Omega-3 Fatty Acids (FISH OIL) 1000 MG CAPS Take 1 capsule by mouth.     No current facility-administered medications on file prior to visit.    Allergies: Patient has no known allergies.  Review of Systems: General:   Denies fever, chills, unexplained weight loss.  Optho/Auditory:   Denies visual changes, blurred vision/LOV Respiratory:   Denies SOB, DOE more than baseline levels.   Cardiovascular:   Denies chest pain, palpitations, new onset peripheral edema  Gastrointestinal:   Denies nausea, vomiting, diarrhea.  Genitourinary: Denies dysuria, freq/ urgency, flank pain.  Endocrine:     Denies hot or cold intolerance, polyuria, polydipsia. Musculoskeletal:   Denies unexplained myalgias, joint swelling, unexplained arthralgias, gait problems.  Skin:  Denies rash, suspicious lesions Neurological:     Denies dizziness, unexplained weakness, numbness  Psychiatric/Behavioral:   Denies mood changes, suicidal or homicidal ideations, hallucinations    Objective:    Blood pressure 113/74, pulse 72, height 5\' 6"  (1.676 m), weight 199 lb 1.6 oz (90.3 kg), SpO2 99 %. Body mass index is 32.14 kg/m. General Appearance:    Alert, cooperative, no distress, appears stated age  Head:     Normocephalic, without obvious abnormality, atraumatic  Eyes:    PERRL, conjunctiva/corneas clear, EOM's intact, both eyes  Ears:    Normal TM's and external ear canals, both ears  Nose:   Nares normal, septum midline, mucosa normal, no drainage    or sinus tenderness  Throat:   Lips w/o lesion, mucosa moist, and tongue normal; teeth and   gums normal  Neck:   Supple, symmetrical, trachea midline, no adenopathy;    thyroid:  no enlargement/tenderness/nodules; no carotid   bruit or JVD  Back:     Symmetric, no curvature, ROM normal, no CVA tenderness  Lungs:     Clear to auscultation bilaterally, respirations unlabored, no       Wh/ R/ R  Chest Wall:    No tenderness or gross deformity; normal excursion   Heart:    Regular rate and rhythm, S1 and S2 normal, no murmur  Breast Exam:    Declined. No concerns/sxs.  Abdomen:     Soft, non-tender, bowel sounds active all four quadrants, No   G/R/R, no masses, no organomegaly  Genitalia:    Ext genitalia: without lesion, no rash, No tenderness; Cervix: WNL's with scant white discharge or lesion;  Adnexa:  No tenderness or palpable masses   Rectal:     Declined.  Extremities:   Extremities normal, atraumatic, no cyanosis or gross edema  Pulses:   2+ and symmetric all extremities  Skin:   Warm, dry, Skin color, texture, turgor normal, no obvious rashes or lesions Psych: No HI/SI, judgement and insight good, Euthymic mood. Full Affect.  Neurologic:   CNII-XII grossly intact, normal strength, sensation and reflexes throughout   .

## 2020-09-19 NOTE — Patient Instructions (Signed)
Preventive Care 53-53 Years Old, Female °Preventive care refers to visits with your health care provider and lifestyle choices that can promote health and wellness. This includes: °· A yearly physical exam. This may also be called an annual well check. °· Regular dental visits and eye exams. °· Immunizations. °· Screening for certain conditions. °· Healthy lifestyle choices, such as eating a healthy diet, getting regular exercise, not using drugs or products that contain nicotine and tobacco, and limiting alcohol use. °What can I expect for my preventive care visit? °Physical exam °Your health care provider will check your: °· Height and weight. This may be used to calculate body mass index (BMI), which tells if you are at a healthy weight. °· Heart rate and blood pressure. °· Skin for abnormal spots. °Counseling °Your health care provider may ask you questions about your: °· Alcohol, tobacco, and drug use. °· Emotional well-being. °· Home and relationship well-being. °· Sexual activity. °· Eating habits. °· Work and work environment. °· Method of birth control. °· Menstrual cycle. °· Pregnancy history. °What immunizations do I need? ° °Influenza (flu) vaccine °· This is recommended every year. °Tetanus, diphtheria, and pertussis (Tdap) vaccine °· You may need a Td booster every 10 years. °Varicella (chickenpox) vaccine °· You may need this if you have not been vaccinated. °Zoster (shingles) vaccine °· You may need this after age 53. °Measles, mumps, and rubella (MMR) vaccine °· You may need at least one dose of MMR if you were born in 1957 or later. You may also need a second dose. °Pneumococcal conjugate (PCV13) vaccine °· You may need this if you have certain conditions and were not previously vaccinated. °Pneumococcal polysaccharide (PPSV23) vaccine °· You may need one or two doses if you smoke cigarettes or if you have certain conditions. °Meningococcal conjugate (MenACWY) vaccine °· You may need this if you  have certain conditions. °Hepatitis A vaccine °· You may need this if you have certain conditions or if you travel or work in places where you may be exposed to hepatitis A. °Hepatitis B vaccine °· You may need this if you have certain conditions or if you travel or work in places where you may be exposed to hepatitis B. °Haemophilus influenzae type b (Hib) vaccine °· You may need this if you have certain conditions. °Human papillomavirus (HPV) vaccine °· If recommended by your health care provider, you may need three doses over 6 months. °You may receive vaccines as individual doses or as more than one vaccine together in one shot (combination vaccines). Talk with your health care provider about the risks and benefits of combination vaccines. °What tests do I need? °Blood tests °· Lipid and cholesterol levels. These may be checked every 5 years, or more frequently if you are over 53 years old. °· Hepatitis C test. °· Hepatitis B test. °Screening °· Lung cancer screening. You may have this screening every year starting at age 53 if you have a 30-pack-year history of smoking and currently smoke or have quit within the past 15 years. °· Colorectal cancer screening. All adults should have this screening starting at age 53 and continuing until age 75. Your health care provider may recommend screening at age 53 if you are at increased risk. You will have tests every 1-10 years, depending on your results and the type of screening test. °· Diabetes screening. This is done by checking your blood sugar (glucose) after you have not eaten for a while (fasting). You may have this   done every 1-3 years.  Mammogram. This may be done every 53 years. Talk with your health care provider about when you should start having regular mammograms. This may depend on whether you have a family history of breast cancer.  BRCA-related cancer screening. This may be done if you have a family history of breast, ovarian, tubal, or peritoneal  cancers.  Pelvic exam and Pap test. This may be done every 3 years starting at age 53. Starting at age 53, this may be done every 5 years if you have a Pap test in combination with an HPV test. Other tests  Sexually transmitted disease (STD) testing.  Bone density scan. This is done to screen for osteoporosis. You may have this scan if you are at high risk for osteoporosis. Follow these instructions at home: Eating and drinking  Eat a diet that includes fresh fruits and vegetables, whole grains, lean protein, and low-fat dairy.  Take vitamin and mineral supplements as recommended by your health care provider.  Do not drink alcohol if: ? Your health care provider tells you not to drink. ? You are pregnant, may be pregnant, or are planning to become pregnant.  If you drink alcohol: ? Limit how much you have to 0-1 drink a day. ? Be aware of how much alcohol is in your drink. In the [place] one drink equals one 12 oz bottle of beer (355 mL), one 5 oz glass of wine (148 mL), or one 1 oz glass of hard liquor (44 mL). Lifestyle  Take daily care of your teeth and gums.  Stay active. Exercise for at least 30 minutes on 5 or more days each week.  Do not use any products that contain nicotine or tobacco, such as cigarettes, e-cigarettes, and chewing tobacco. If you need help quitting, ask your health care provider.  If you are sexually active, practice safe sex. Use a condom or other form of birth control (contraception) in order to prevent pregnancy and STIs (sexually transmitted infections).  If told by your health care provider, take low-dose aspirin daily starting at age 53. What's next?  Visit your health care provider once a year for a well check visit.  Ask your health care provider how often you should have your eyes and teeth checked.  Stay up to date on all vaccines. This information is not intended to replace advice given to you by your health care provider. Make sure you  discuss any questions you have with your health care provider. Document Revised: 07/17/2018 Document Reviewed: 07/17/2018 Elsevier Patient Education  2020 [employer].

## 2020-09-22 LAB — CYTOLOGY - PAP
Comment: NEGATIVE
Diagnosis: NEGATIVE
High risk HPV: POSITIVE — AB

## 2020-09-23 ENCOUNTER — Other Ambulatory Visit: Payer: Self-pay

## 2020-09-23 DIAGNOSIS — R87811 Vaginal high risk human papillomavirus (HPV) DNA test positive: Secondary | ICD-10-CM

## 2020-11-11 ENCOUNTER — Other Ambulatory Visit: Payer: Self-pay | Admitting: Physician Assistant

## 2020-11-11 DIAGNOSIS — F32A Depression, unspecified: Secondary | ICD-10-CM

## 2020-11-11 DIAGNOSIS — F4323 Adjustment disorder with mixed anxiety and depressed mood: Secondary | ICD-10-CM

## 2020-11-14 ENCOUNTER — Ambulatory Visit: Payer: Self-pay | Admitting: Obstetrics and Gynecology

## 2020-11-15 ENCOUNTER — Other Ambulatory Visit: Payer: Self-pay

## 2020-11-15 ENCOUNTER — Ambulatory Visit
Admission: EM | Admit: 2020-11-15 | Discharge: 2020-11-15 | Disposition: A | Discharge status: Home / Self Care | Payer: 59 | Attending: Family Medicine | Admitting: Family Medicine

## 2020-11-15 ENCOUNTER — Telehealth: Payer: Self-pay | Admitting: Physician Assistant

## 2020-11-15 DIAGNOSIS — N644 Mastodynia: Secondary | ICD-10-CM | POA: Diagnosis not present

## 2020-11-15 DIAGNOSIS — F4323 Adjustment disorder with mixed anxiety and depressed mood: Secondary | ICD-10-CM

## 2020-11-15 DIAGNOSIS — F32A Depression, unspecified: Secondary | ICD-10-CM

## 2020-11-15 MED ORDER — BUPROPION HCL ER (XL) 300 MG PO TB24: 300.0000 mg | ORAL_TABLET | Freq: Every day | ORAL | 0 refills | Status: DC

## 2020-11-15 NOTE — Telephone Encounter (Signed)
Med refill sent to [facility]. AS, CMA

## 2020-11-15 NOTE — Discharge Instructions (Addendum)
Nothing concerning on exam Warm compresses to the area.  Try some antibiotic cream Follow up as needed for continued or worsening symptoms

## 2020-11-15 NOTE — ED Triage Notes (Signed)
Pt reports bump under R breast that appears infected.  Red and warm.  Initially noticed 2 mos ago.   Requesting COVID test d/t exposure 2 days ago.  Asymptomatic.

## 2020-11-16 NOTE — ED Provider Notes (Signed)
Renaldo Fiddler    CSN: [id number] Arrival date & time: 11/15/20  1012      History   Chief Complaint Chief Complaint  Patient presents with  . Abscess    HPI Stephanie Chan is a 54 y.o. female.   Patient is a 54 year old female presents today with area under the right breast that is erythematous, mildly warm and tender.  Initially noticed this 2 months ago.  Has somewhat improved.  Started as what she thought was an ingrown hair.  No fevers, chills.  No drainage.  No palpated lumps or nipple discharge Patient would also like to be Covid tested.  No Covid symptoms but had exposure 2 days ago.     Past Medical History:  Diagnosis Date  . Allergy   . Bundle branch block   . Depression   . Migraines   . Panic attack   . Vertigo     Patient Active Problem List   Diagnosis Date Noted  . Sleeping difficulties 02/12/2019  . Panic anxiety syndrome 10/06/2018  . Olecranon bursitis of right elbow 10/06/2018  . Personal history of noncompliance with medical treatment and regimen 10/06/2018  . Encounter for wellness examination 10/22/2017  . Counseling on health promotion and disease prevention 10/27/2016  . Vitamin D insufficiency 10/05/2016  . Perimenopausal hot flashes.  08/08/2016  . BMI 30.0-30.9,adult 08/08/2016  . Depression 08/08/2016  . Adjustment disorder with mixed anxiety and depressed mood 08/08/2016  . Environmental and seasonal allergies 08/08/2016  . History of migraines 08/08/2016    Past Surgical History:  Procedure Laterality Date  . CESAREAN SECTION    . WISDOM TOOTH EXTRACTION      OB History   No obstetric history on file.      Home Medications    Prior to Admission medications   Medication Sig Start Date End Date Taking? Authorizing Provider  acetaminophen (TYLENOL) 500 MG tablet Take 1,000 mg by mouth every 6 (six) hours as needed for moderate pain or headache. Pain    [provider]  ALPRAZolam (XANAX) 0.5 MG  tablet Take 0.5-1 tablets (0.25-0.5 mg total) by mouth as needed for anxiety (panic attack). anxiety 03/10/20   Mayer Masker, PA-C  aspirin EC 81 MG tablet Take 81 mg by mouth daily.    [provider]  B Complex-Biotin-FA (B-COMPLEX PO) Take 1 capsule by mouth daily.    [provider]  buPROPion (WELLBUTRIN XL) 300 MG 24 hr tablet Take 1 tablet (300 mg total) by mouth daily. 11/15/20   Mayer Masker, PA-C  cetirizine-pseudoephedrine (ZYRTEC-D) 5-120 MG per tablet Take 1 tablet by mouth daily as needed for allergies.     [provider]  Cholecalciferol (VITAMIN D3) 5000 units CAPS Take 1 capsule (5,000 Units total) by mouth daily. 04/09/17   Opalski, Deborah, DO  escitalopram (LEXAPRO) 10 MG tablet TAKE 1 TABLET BY MOUTH DAILY. 11/15/20   Mayer Masker, PA-C  ibuprofen (ADVIL,MOTRIN) 200 MG tablet Take 400 mg by mouth every 6 (six) hours as needed. Pain    [provider]  Multiple Vitamin (MULTIVITAMIN) tablet Take 1 tablet by mouth daily.    [provider]  Omega-3 Fatty Acids (FISH OIL) 1000 MG CAPS Take 1 capsule by mouth.    [provider]    Family History Family History  Problem Relation Age of Onset  . Thyroid disease Mother 41  . Kidney disease Father 32  . Heart disease Father 51  . Healthy Sister   .  Healthy Brother   . Healthy Daughter   . Healthy Sister   . Healthy Sister   . Healthy Daughter   . Healthy Daughter   . Colon cancer Neg Hx   . Colon polyps Neg Hx   . Rectal cancer Neg Hx   . Stomach cancer Neg Hx     Social History Social History   Tobacco Use  . Smoking status: Never Smoker  . Smokeless tobacco: Never Used  Vaping Use  . Vaping Use: Never used  Substance Use Topics  . Alcohol use: Yes    Alcohol/week: 2.0 standard drinks    Types: 2 Glasses of wine per week    Comment: socially  . Drug use: No     Allergies   Patient has no known allergies.   Review of Systems Review of  Systems   Physical Exam Triage Vital Signs ED Triage Vitals  Enc Vitals Group     BP 11/15/20 1203 118/75     Pulse Rate 11/15/20 1203 67     Resp 11/15/20 1203 18     Temp 11/15/20 1203 97.6 F (36.4 C)     Temp Source 11/15/20 1203 Oral     SpO2 11/15/20 1203 98 %     Weight --      Height --      Head Circumference --      Peak Flow --      Pain Score 11/15/20 1205 0     Pain Loc --      Pain Edu? --      Excl. in GC? --    No data found.  Updated Vital Signs BP 118/75 (BP Location: Left Arm)   Pulse 67   Temp 97.6 F (36.4 C) (Oral)   Resp 18   SpO2 98%   Visual Acuity Right Eye Distance:   Left Eye Distance:   Bilateral Distance:    Right Eye Near:   Left Eye Near:    Bilateral Near:     Physical Exam Vitals and nursing note reviewed.  Constitutional:      General: She is not in acute distress.    Appearance: Normal appearance. She is not ill-appearing, toxic-appearing or diaphoretic.  HENT:     Head: Normocephalic.     Nose: Nose normal.  Eyes:     Conjunctiva/sclera: Conjunctivae normal.  Pulmonary:     Effort: Pulmonary effort is normal.  Chest:     Comments: Mild erythema and inflamed follicle.  Some bruising.  Mildly tender to touch.  No increased warmth.  No palpable nodules.  No nipple discharge. No swelling Musculoskeletal:        General: Normal range of motion.     Cervical back: Normal range of motion.  Skin:    General: Skin is warm and dry.     Findings: No rash.  Neurological:     Mental Status: She is alert.  Psychiatric:        Mood and Affect: Mood normal.      UC Treatments / Results  Labs (all labs ordered are listed, but only abnormal results are displayed) Labs Reviewed  NOVEL CORONAVIRUS, NAA    EKG   Radiology No results found.  Procedures Procedures (including critical care time)  Medications Ordered in UC Medications - No data to display  Initial Impression / Assessment and Plan / UC Course  I  have reviewed the triage vital signs and the nursing notes.  Pertinent labs &  imaging results that were available during my care of the patient were reviewed by me and considered in my medical decision making (see chart for details).     Breast pain Nothing concerning on exam today.  No palpable nodules or concerns.  No concerns for cellulitis at this time.  Recommend warm compresses to the area and antibiotic cream for the inflamed follicle.  Also recommended wearing looser fit bra Follow up as needed for continued or worsening symptoms  Final Clinical Impressions(s) / UC Diagnoses   Final diagnoses:  Breast pain in female     Discharge Instructions     Nothing concerning on exam Warm compresses to the area.  Try some antibiotic cream Follow up as needed for continued or worsening symptoms     ED Prescriptions    None     PDMP not reviewed this encounter.   Janace Aris, NP 11/16/20 1036

## 2020-11-17 LAB — SARS-COV-2, NAA 2 DAY TAT

## 2020-11-17 LAB — NOVEL CORONAVIRUS, NAA: SARS-CoV-2, NAA: NOT DETECTED

## 2020-11-23 ENCOUNTER — Ambulatory Visit: Payer: Self-pay | Admitting: Obstetrics and Gynecology

## 2020-12-27 ENCOUNTER — Other Ambulatory Visit: Payer: Self-pay | Admitting: Physician Assistant

## 2020-12-27 DIAGNOSIS — F32A Depression, unspecified: Secondary | ICD-10-CM

## 2020-12-27 DIAGNOSIS — F4323 Adjustment disorder with mixed anxiety and depressed mood: Secondary | ICD-10-CM

## 2021-01-11 ENCOUNTER — Other Ambulatory Visit: Payer: Self-pay

## 2021-01-11 ENCOUNTER — Encounter: Payer: Self-pay | Admitting: Obstetrics & Gynecology

## 2021-01-11 ENCOUNTER — Ambulatory Visit (INDEPENDENT_AMBULATORY_CARE_PROVIDER_SITE_OTHER): Payer: 59 | Admitting: Obstetrics & Gynecology

## 2021-01-11 VITALS — BP 118/84 | HR 88 | Resp 20 | Ht 65.35 in | Wt 197.6 lb

## 2021-01-11 DIAGNOSIS — R87810 Cervical high risk human papillomavirus (HPV) DNA test positive: Secondary | ICD-10-CM | POA: Diagnosis not present

## 2021-01-11 NOTE — Progress Notes (Signed)
° ° °  Stephanie Chan [birth year] [id number]        55 y.o. G3P3L3 Married.  Vasectomy.    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  No previous h/o abnormal Pap.  Declines STI screen.  Postmenopausal x >5 years on no HRT.  No PMB.  No pelvic pain.  No vaginal d/c.   OB History  No obstetric history on file.    Past medical history,surgical history, problem list, medications, allergies, family history and social history were all reviewed and documented in the EPIC chart.   Directed ROS with pertinent positives and negatives documented in the history of present illness/assessment and plan.  Exam:  Vitals:   01/11/21 1427  BP: 118/84  Pulse: 88  Resp: 20  Weight: 197 lb 9.6 oz (89.6 kg)  Height: 5' 5.35" (1.66 m)   General appearance:  Normal  Abdomen: Normal  Gynecologic exam: Vulva normal.  Speculum:  Cervix/Vagina normal.  Pap/HPV 16-18-45 done.   Assessment/Plan:  55 y.o. No obstetric history on file.   1. Pap smear of cervix shows high risk HPV present Counseling on HR HPV and Pap done.  Colposcopy offered, but given the Negative Pap, prefers a repeat Pap with HPV 16-18-45 testing today.  Colposcopy per results.  Patient voiced understanding and agreement with the plan.  Genia Del MD, 3:09 PM 01/11/2021

## 2021-01-12 ENCOUNTER — Encounter: Payer: Self-pay | Admitting: Obstetrics & Gynecology

## 2021-01-17 ENCOUNTER — Encounter: Payer: Self-pay | Admitting: Physician Assistant

## 2021-01-17 ENCOUNTER — Ambulatory Visit (INDEPENDENT_AMBULATORY_CARE_PROVIDER_SITE_OTHER): Payer: 59 | Admitting: Physician Assistant

## 2021-01-17 VITALS — BP 113/70 | HR 70 | Temp 97.9°F | Ht 65.35 in | Wt 194.1 lb

## 2021-01-17 DIAGNOSIS — F32A Depression, unspecified: Secondary | ICD-10-CM | POA: Diagnosis not present

## 2021-01-17 DIAGNOSIS — F4323 Adjustment disorder with mixed anxiety and depressed mood: Secondary | ICD-10-CM | POA: Diagnosis not present

## 2021-01-17 DIAGNOSIS — F410 Panic disorder [episodic paroxysmal anxiety] without agoraphobia: Secondary | ICD-10-CM | POA: Diagnosis not present

## 2021-01-17 MED ORDER — ESCITALOPRAM OXALATE 10 MG PO TABS: 5.0000 mg | ORAL_TABLET | Freq: Every day | ORAL | 1 refills | Status: DC

## 2021-01-17 NOTE — Progress Notes (Signed)
Established Patient Office Visit  Subjective:  Patient ID: Stephanie Chan, female    DOB: November 27, 1965  Age: 55 y.o. MRN: [id number]  CC:  Chief Complaint  Patient presents with  . Depression    HPI Stephanie Chan presents for follow up on mood management. Patient reports has been on Wellbutrin and Lexapro for many years.  Was initially started on Wellbutrin and then Lexapro was added on.  States has been on Lexapro for approximately 20 years.  Has noticed decreased libido and unsure if related to menopause, medication side effect or general fatigue.  States mood has remained pretty much stable with a few ups and downs.  In the past has tried Prozac and Zoloft.  Past Medical History:  Diagnosis Date  . Allergy   . Bundle branch block   . Depression   . Migraines   . Panic attack   . Vertigo     Past Surgical History:  Procedure Laterality Date  . CESAREAN SECTION    . WISDOM TOOTH EXTRACTION      Family History  Problem Relation Age of Onset  . Thyroid disease Mother 42  . Kidney disease Father 71  . Heart disease Father 45  . Healthy Sister   . Healthy Brother   . Healthy Daughter   . Healthy Sister   . Healthy Sister   . Healthy Daughter   . Healthy Daughter   . Colon cancer Neg Hx   . Colon polyps Neg Hx   . Rectal cancer Neg Hx   . Stomach cancer Neg Hx     Social History   Socioeconomic History  . Marital status: Married    Spouse name: Not on file  . Number of children: Not on file  . Years of education: Not on file  . Highest education level: Not on file  Occupational History  . Not on file  Tobacco Use  . Smoking status: Never Smoker  . Smokeless tobacco: Never Used  Vaping Use  . Vaping Use: Never used  Substance and Sexual Activity  . Alcohol use: Yes    Alcohol/week: 2.0 standard drinks    Types: 2 Glasses of wine per week    Comment: socially  . Drug use: No  . Sexual activity: Yes    Partners: Male    Birth  control/protection: Surgical, Post-menopausal  Other Topics Concern  . Not on file  Social History Narrative  . Not on file   Social Determinants of Health   Financial Resource Strain: Not on file  Food Insecurity: Not on file  Transportation Needs: Not on file  Physical Activity: Not on file  Stress: Not on file  Social Connections: Not on file  Intimate Partner Violence: Not on file    Outpatient Medications Prior to Visit  Medication Sig Dispense Refill  . acetaminophen (TYLENOL) 500 MG tablet Take 1,000 mg by mouth every 6 (six) hours as needed for moderate pain or headache. Pain    . ALPRAZolam (XANAX) 0.5 MG tablet Take 0.5-1 tablets (0.25-0.5 mg total) by mouth as needed for anxiety (panic attack). anxiety 30 tablet 0  . aspirin EC 81 MG tablet Take 81 mg by mouth daily.    . B Complex-Biotin-FA (B-COMPLEX PO) Take 1 capsule by mouth daily.    Marland Kitchen buPROPion (WELLBUTRIN XL) 300 MG 24 hr tablet TAKE 1 TABLET BY MOUTH DAILY. 30 tablet 0  . Cholecalciferol (VITAMIN D3) 5000 units CAPS Take 1 capsule (5,000 Units total)  by mouth daily. 30 capsule   . escitalopram (LEXAPRO) 10 MG tablet TAKE 1 TABLET BY MOUTH DAILY. 30 tablet 1  . ibuprofen (ADVIL,MOTRIN) 200 MG tablet Take 400 mg by mouth every 6 (six) hours as needed. Pain    . Multiple Vitamin (MULTIVITAMIN) tablet Take 1 tablet by mouth daily.    . cetirizine-pseudoephedrine (ZYRTEC-D) 5-120 MG per tablet Take 1 tablet by mouth daily as needed for allergies.  (Patient not taking: Reported on 01/17/2021)     No facility-administered medications prior to visit.    No Known Allergies  ROS Review of Systems A fourteen system review of systems was performed and found to be positive as per HPI.   Objective:    Physical Exam General:  Well Developed, well nourished, appropriate for stated age.  Neuro:  Alert and oriented,  extra-ocular muscles intact  HEENT:  Normocephalic, atraumatic, neck supple Skin:  no gross rash, warm,  pink. Cardiac:  RRR, S1 S2 wnl's Respiratory:  ECTA B/L w/o wheezing, Not using accessory muscles, speaking in full sentences- unlabored. Vascular:  Ext warm, no cyanosis apprec.; cap RF less 2 sec. Psych:  No HI/SI, judgement and insight good, Euthymic mood. Full Affect.   BP 113/70   Pulse 70   Temp 97.9 F (36.6 C) (Oral)   Ht 5' 5.35" (1.66 m)   Wt 194 lb 1.6 oz (88 kg)   SpO2 100%   BMI 31.95 kg/m  Wt Readings from Last 3 Encounters:  01/17/21 194 lb 1.6 oz (88 kg)  01/11/21 197 lb 9.6 oz (89.6 kg)  09/19/20 199 lb 1.6 oz (90.3 kg)     Health Maintenance Due  Topic Date Due  . COVID-19 Vaccine (1) Never done  . MAMMOGRAM  Never done    There are no preventive care reminders to display for this patient.  Lab Results  Component Value Date   TSH 0.853 09/12/2020   Lab Results  Component Value Date   WBC 4.0 09/12/2020   HGB 12.2 09/12/2020   HCT 36.5 09/12/2020   MCV 89 09/12/2020   PLT 191 09/12/2020   Lab Results  Component Value Date   NA 142 09/12/2020   K 4.0 09/12/2020   CO2 23 09/12/2020   GLUCOSE 88 09/12/2020   BUN 17 09/12/2020   CREATININE 0.77 09/12/2020   BILITOT 0.2 09/12/2020   ALKPHOS 101 09/12/2020   AST 16 09/12/2020   ALT 17 09/12/2020   PROT 7.0 09/12/2020   ALBUMIN 4.3 09/12/2020   CALCIUM 8.7 09/12/2020   Lab Results  Component Value Date   CHOL 174 09/12/2020   Lab Results  Component Value Date   HDL 51 09/12/2020   Lab Results  Component Value Date   LDLCALC 105 (H) 09/12/2020   Lab Results  Component Value Date   TRIG 100 09/12/2020   Lab Results  Component Value Date   CHOLHDL 3.4 09/12/2020   Lab Results  Component Value Date   HGBA1C 5.1 09/12/2020      Assessment & Plan:   Problem List Items Addressed This Visit      Other   Adjustment disorder with mixed anxiety and depressed mood (Chronic)   Panic anxiety syndrome - Primary     Adjustment disorder with mixed anxiety and depressed  mood: -PHQ-9 score of 8, patient's baseline. Denies SI/HI. -Discussed with patient alternative management options such as changing SSRI to medication with low risk for sexual dysfunction or trial discontinuing Lexapro 10 mg for  a few weeks to evaluate if potential medication side effect.  Patient prefers to reduce Lexapro dose to 5 mg which is reasonable.  Advised to start taking half a tablet of 10 mg.  Continue Wellbutrin XL 300 mg.  Recommend to consider behavioral health therapy. -Follow-up in 3 months to reassess symptoms and medication therapy.  Panic anxiety syndrome: -Stable. -Takes alprazolam as needed.  PDMP reviewed, no aberrancies noted.  Alprazolam 0.5 mg last filled 03/10/2020. -Will continue to monitor.   No orders of the defined types were placed in this encounter.   Follow-up: Return in about 3 months (around 04/19/2021) for Mood- dec med dose.   Note:  This note was prepared with assistance of Dragon voice recognition software. Occasional wrong-word or sound-a-like substitutions may have occurred due to the inherent limitations of voice recognition software.   Mayer Masker, PA-C

## 2021-01-18 LAB — PAP, TP IMAGING W/ HPV RNA, RFLX HPV TYPE 16,18/45: HPV DNA High Risk: DETECTED — AB

## 2021-01-18 LAB — HPV TYPE 16 AND 18/45 RNA
HPV Type 16 RNA: NOT DETECTED
HPV Type 18/45 RNA: NOT DETECTED

## 2021-01-25 ENCOUNTER — Other Ambulatory Visit: Payer: Self-pay

## 2021-01-25 MED ORDER — TINIDAZOLE 500 MG PO TABS: ORAL_TABLET | ORAL | 0 refills | Status: DC

## 2021-01-26 ENCOUNTER — Other Ambulatory Visit: Payer: Self-pay

## 2021-01-26 DIAGNOSIS — B977 Papillomavirus as the cause of diseases classified elsewhere: Secondary | ICD-10-CM

## 2021-02-03 ENCOUNTER — Other Ambulatory Visit: Payer: Self-pay

## 2021-02-03 ENCOUNTER — Encounter: Payer: Self-pay | Admitting: Obstetrics & Gynecology

## 2021-02-03 ENCOUNTER — Ambulatory Visit (INDEPENDENT_AMBULATORY_CARE_PROVIDER_SITE_OTHER): Payer: 59 | Admitting: Obstetrics & Gynecology

## 2021-02-03 DIAGNOSIS — N870 Mild cervical dysplasia: Secondary | ICD-10-CM | POA: Diagnosis not present

## 2021-02-03 DIAGNOSIS — B977 Papillomavirus as the cause of diseases classified elsewhere: Secondary | ICD-10-CM

## 2021-02-03 NOTE — Addendum Note (Signed)
Addended by: Berna Spare A on: 02/03/2021 02:14 PM   Modules accepted: Orders

## 2021-02-03 NOTE — Progress Notes (Signed)
    Stephanie Chan 24-Nov-1965 [id number]        55 y.o.    RP: HR HPV positive for Colposcopy  HPI: HR HPV positive with Pap neg on 01/11/2021.  HPV 16-18-45 Neg.   OB History  No obstetric history on file.    Past medical history,surgical history, problem list, medications, allergies, family history and social history were all reviewed and documented in the EPIC chart.   Directed ROS with pertinent positives and negatives documented in the history of present illness/assessment and plan.  Exam:  Vitals:   02/03/21 1108  BP: 124/78   General appearance:  Normal  Colposcopy Procedure Note Stephanie Chan 02/03/2021  Indications:  HR HPV pos  Procedure Details  The risks and benefits of the procedure and Verbal informed consent obtained.  Speculum placed in vagina and excellent visualization of cervix achieved, cervix swabbed x 3 with acetic acid solution.  Findings:   Cervix colposcopy: Physical Exam Genitourinary:       Vaginal colposcopy: Normal  Vulvar colposcopy: Normal  Perirectal colposcopy: Normal  The cervix was sprayed with Hurricane before performing the cervical biopsies.  Specimens:  Cervical Bx at 4 O'Clock  Complications: None, good hemostasis with Silver Nitrate . Plan: Management per cervical Bx results   Assessment/Plan:  55 y.o.  1. 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  Pap test negative.  HPV 16-18-45 negative.  Counseling done on high risk HPV and the risk of abnormal Pap test leading to cervical cancer in the future.  Patient reassured about HPV 16-18-45 being negative.  Decision to perform a colposcopy today.  Procedure reviewed.  Colposcopy findings discussed with patient.  Management per cervical biopsy results.  Postprocedure precautions reviewed. - Colposcopy  Genia Del MD, 11:23 AM 02/03/2021

## 2021-02-07 LAB — PATHOLOGY REPORT

## 2021-02-07 LAB — TISSUE SPECIMEN

## 2021-04-18 ENCOUNTER — Telehealth: Payer: Self-pay | Admitting: Physician Assistant

## 2021-04-18 ENCOUNTER — Other Ambulatory Visit: Payer: Self-pay | Admitting: Physician Assistant

## 2021-04-18 DIAGNOSIS — F32A Depression, unspecified: Secondary | ICD-10-CM

## 2021-04-18 DIAGNOSIS — F4323 Adjustment disorder with mixed anxiety and depressed mood: Secondary | ICD-10-CM

## 2021-04-18 NOTE — Telephone Encounter (Signed)
Patient scheduled.

## 2021-04-18 NOTE — Telephone Encounter (Signed)
Patient needs apt for refill. Please contact to schedule per last AVS. AS, CMA

## 2021-05-03 ENCOUNTER — Ambulatory Visit (INDEPENDENT_AMBULATORY_CARE_PROVIDER_SITE_OTHER): Payer: 59 | Admitting: Physician Assistant

## 2021-05-03 ENCOUNTER — Encounter: Payer: Self-pay | Admitting: Physician Assistant

## 2021-05-03 VITALS — Ht 66.0 in | Wt 189.0 lb

## 2021-05-03 DIAGNOSIS — F32A Depression, unspecified: Secondary | ICD-10-CM

## 2021-05-03 DIAGNOSIS — F4323 Adjustment disorder with mixed anxiety and depressed mood: Secondary | ICD-10-CM | POA: Diagnosis not present

## 2021-05-03 DIAGNOSIS — F410 Panic disorder [episodic paroxysmal anxiety] without agoraphobia: Secondary | ICD-10-CM

## 2021-05-03 MED ORDER — BUPROPION HCL ER (XL) 300 MG PO TB24: 300.0000 mg | ORAL_TABLET | Freq: Every day | ORAL | 1 refills | Status: DC

## 2021-05-03 NOTE — Patient Instructions (Signed)

## 2021-05-03 NOTE — Progress Notes (Signed)
Telehealth office visit note for Stephanie Masker, PA-C- at Primary Care at [facility]   I connected with current patient today by telephone and verified that I am speaking with the correct person    Location of the patient: Home  Location of the provider: Office - This visit type was conducted due to national recommendations for restrictions regarding the COVID-19 Pandemic (e.g. social distancing) in an effort to limit this patient's exposure and mitigate transmission in our community.    - No physical exam could be performed with this format, beyond that communicated to [place] by the patient/ family members as noted.   - Additionally my office staff/ schedulers were to discuss with the patient that there may be a monetary charge related to this service, depending on their medical insurance.  My understanding is that patient understood and consented to proceed.     _________________________________________________________________________________   History of Present Illness: Patient calls in to follow up on mood management. Lexapro was reduced to 5 mg at last office visit due to concerns of side effects (sexual dysfunction) and patient reports an improvement. States sometimes will need to take full dose (10 mg) of Lexapro depending on her stress levels. Overall, mood is stable. Denies increased/significant anxiety. States went to [place] for a week on a humanitarian trip and had to take 1-2 doses of Xanax, but still has about 10-15 pills.      GAD 7 : Generalized Anxiety Score 05/03/2021 03/10/2020 10/06/2018 08/09/2017  Nervous, Anxious, on Edge 3 1 1 1   Control/stop worrying 3 1 0 0  Worry too much - different things 2 1 1 1   Trouble relaxing 1 1 1 1   Restless 1 0 1 1  Easily annoyed or irritable 1 1 1  0  Afraid - awful might happen 0 0 1 0  Total GAD 7 Score 11 5 6 4   Anxiety Difficulty Not difficult at all Not difficult at all Somewhat difficult Somewhat difficult    Depression  screen [facility] 2/9 05/03/2021 01/17/2021 09/19/2020 03/10/2020 09/28/2019  Decreased Interest 0 0 1 0 1  Down, Depressed, Hopeless 0 0 1 0 1  PHQ - 2 Score 0 0 2 0 2  Altered sleeping 0 1 2 1 1   Tired, decreased energy 0 0 1 1 1   Change in appetite 0 0 0 0 0  Feeling bad or failure about yourself  0 1 1 2  0  Trouble concentrating 0 1 2 1 1   Moving slowly or fidgety/restless 0 0 0 1 0  Suicidal thoughts 0 0 0 0 0  PHQ-9 Score 0 3 8 6 5   Difficult doing work/chores - - Somewhat difficult Not difficult at all Not difficult at all  Some recent data might be hidden      Impression and Recommendations:     1. Panic anxiety syndrome   2. Adjustment disorder with mixed anxiety and depressed mood   3. Depression, unspecified depression type      Panic anxiety syndrome, Adjustment disorder with mixed anxiety and depressed mood, Depression: -PHQ-9 score of 0, GAD-7 score of 11. Denies SI/HI. -Patient reports improvement with sexual dysfunction with lower dose of Lexapro so will continue with current medication regimen. GAD-7 score higher than prior so recommend to monitor symptoms and incorporate mindfulness therapy/stress reduction techniques.  -Will continue to monitor.   - As part of my medical decision making, I reviewed the following data within the electronic MEDICAL RECORD NUMBER History  obtained from pt /family, CMA notes reviewed and incorporated if applicable, Labs reviewed, Radiograph/ tests reviewed if applicable and OV notes from prior OV's with me, as well as any other specialists she/he has seen since seeing me last, were all reviewed and used in my medical decision making process today.    - Additionally, when appropriate, discussion had with patient regarding our treatment plan, and their biases/concerns about that plan were used in my medical decision making today.    - The patient agreed with the plan and demonstrated an understanding of the instructions.   No barriers to understanding  were identified.     - The patient was advised to call back or seek an in-person evaluation if the symptoms worsen or if the condition fails to improve as anticipated.   Return in about 5 months (around 10/03/2021) for CPE and FBW .    No orders of the defined types were placed in this encounter.   Meds ordered this encounter  Medications   buPROPion (WELLBUTRIN XL) 300 MG 24 hr tablet    Sig: Take 1 tablet (300 mg total) by mouth daily.    Dispense:  90 tablet    Refill:  1    Order Specific Question:   Supervising Provider    Answer:   Nani Gasser D [[id number]]     Medications Discontinued During This Encounter  Medication Reason   buPROPion (WELLBUTRIN XL) 300 MG 24 hr tablet Reorder       Time spent on telephone encounter was 7 minutes.   Note:  This note was prepared with assistance of Dragon voice recognition software. Occasional wrong-word or sound-a-like substitutions may have occurred due to the inherent limitations of voice recognition software.    The 21st Century Cures Act was signed into law in 2016 which includes the topic of electronic health records.  This provides immediate access to information in MyChart.  This includes consultation notes, operative notes, office notes, lab results and pathology reports.  If you have any questions about what you read please let us know at your next visit or call us at the office.  We are right here with you.   __________________________________________________________________________________     Patient Care Team    Relationship Specialty Notifications Start End  Stephanie Chan, [place] PCP - General   03/20/20      -Vitals obtained; medications/ allergies reconciled;  personal medical, social, Sx etc.histories were updated by CMA, reviewed by me and are reflected in chart   Patient Active Problem List   Diagnosis Date Noted   Sleeping difficulties 02/12/2019   Panic anxiety syndrome 10/06/2018   Olecranon  bursitis of right elbow 10/06/2018   Personal history of noncompliance with medical treatment and regimen 10/06/2018   Encounter for wellness examination 10/22/2017   Counseling on health promotion and disease prevention 10/27/2016   Vitamin D insufficiency 10/05/2016   Perimenopausal hot flashes.  08/08/2016   BMI 30.0-30.9,adult 08/08/2016   Depression 08/08/2016   Adjustment disorder with mixed anxiety and depressed mood 08/08/2016   Environmental and seasonal allergies 08/08/2016   History of migraines 08/08/2016     Current Meds  Medication Sig   acetaminophen (TYLENOL) 500 MG tablet Take 1,000 mg by mouth every 6 (six) hours as needed for moderate pain or headache. Pain   ALPRAZolam (XANAX) 0.5 MG tablet Take 0.5-1 tablets (0.25-0.5 mg total) by mouth as needed for anxiety (panic attack). anxiety   aspirin EC 81 MG tablet Take 81 mg  by mouth daily.   B Complex-Biotin-FA (B-COMPLEX PO) Take 1 capsule by mouth daily.   cetirizine-pseudoephedrine (ZYRTEC-D) 5-120 MG per tablet Take 1 tablet by mouth daily as needed for allergies.   Cholecalciferol (VITAMIN D3) 5000 units CAPS Take 1 capsule (5,000 Units total) by mouth daily.   escitalopram (LEXAPRO) 10 MG tablet Take 0.5 tablets (5 mg total) by mouth daily.   ibuprofen (ADVIL,MOTRIN) 200 MG tablet Take 400 mg by mouth every 6 (six) hours as needed. Pain   Multiple Vitamin (MULTIVITAMIN) tablet Take 1 tablet by mouth daily.   [DISCONTINUED] buPROPion (WELLBUTRIN XL) 300 MG 24 hr tablet Take 1 tablet (300 mg total) by mouth daily. **NEEDS APT FOR REFILLS**     Allergies:  No Known Allergies   ROS:  See above HPI for pertinent positives and negatives   Objective:   Height 5\' 6"  (1.676 m), weight 189 lb (85.7 kg).   (if some vitals are omitted, this means that patient was UNABLE to obtain them. ) General: A & O * 3; sounds in no acute distress Respiratory: speaking in full sentences, no conversational dyspnea Psych: insight  appears good, mood- appears full

## 2021-05-08 ENCOUNTER — Ambulatory Visit: Payer: 59

## 2021-05-12 ENCOUNTER — Ambulatory Visit: Payer: 59

## 2021-06-09 ENCOUNTER — Other Ambulatory Visit: Payer: Self-pay | Admitting: Physician Assistant

## 2021-06-09 ENCOUNTER — Other Ambulatory Visit: Payer: Self-pay

## 2021-06-09 ENCOUNTER — Ambulatory Visit (INDEPENDENT_AMBULATORY_CARE_PROVIDER_SITE_OTHER): Payer: 59 | Admitting: Nurse Practitioner

## 2021-06-09 ENCOUNTER — Encounter: Payer: Self-pay | Admitting: Nurse Practitioner

## 2021-06-09 VITALS — BP 100/68 | HR 81 | Temp 96.1°F | Ht 66.0 in | Wt 191.4 lb

## 2021-06-09 DIAGNOSIS — R21 Rash and other nonspecific skin eruption: Secondary | ICD-10-CM | POA: Diagnosis not present

## 2021-06-09 DIAGNOSIS — F4323 Adjustment disorder with mixed anxiety and depressed mood: Secondary | ICD-10-CM

## 2021-06-09 DIAGNOSIS — F32A Depression, unspecified: Secondary | ICD-10-CM

## 2021-06-09 MED ORDER — METHYLPREDNISOLONE 4 MG PO TBPK
ORAL_TABLET | ORAL | 0 refills | Status: DC
Start: 2021-06-09 — End: 2021-06-14

## 2021-06-09 MED ORDER — TRIAMCINOLONE ACETONIDE 0.1 % EX OINT: 1.0000 "application " | TOPICAL_OINTMENT | Freq: Two times a day (BID) | CUTANEOUS | 2 refills | Status: DC

## 2021-06-09 NOTE — Progress Notes (Signed)
Acute Office Visit  Subjective:    Patient ID: Stephanie Chan, female    DOB: August 25, 1966, 55 y.o.   MRN: [id number]  Chief Complaint  Patient presents with   Acute Visit    HPI Patient is in today for evaluation of rash.  States she believes she has not been exposed to poison ivy outside with her dog.  Rash started Monday.  Blisterlike lesions on lower legs gradually started to spread upwards also be starting on hands and forearms and gradually spreading up her arms.  Lesions are extremely itchy.  They are in various stages of healing.  Patient is now spreading to face, neck, and hips.  She states itchiness is so bad is keeping her awake at night.  She denies swelling of her lips face or tongue.  She denies other symptoms at this time.  Past Medical History:  Diagnosis Date   Allergy    Bundle branch block    Depression    Migraines    Panic attack    Vertigo     Past Surgical History:  Procedure Laterality Date   CESAREAN SECTION     WISDOM TOOTH EXTRACTION      Family History  Problem Relation Age of Onset   Thyroid disease Mother 15   Kidney disease Father 60   Heart disease Father 86   Healthy Sister    Soil scientist Daughter    Healthy Sister    Healthy Sister    Healthy Daughter    Healthy Daughter    Colon cancer Neg Hx    Colon polyps Neg Hx    Rectal cancer Neg Hx    Stomach cancer Neg Hx     Social History   Socioeconomic History   Marital status: Married    Spouse name: Not on file   Number of children: Not on file   Years of education: Not on file   Highest education level: Not on file  Occupational History   Not on file  Tobacco Use   Smoking status: Never   Smokeless tobacco: Never  Vaping Use   Vaping Use: Never used  Substance and Sexual Activity   Alcohol use: Yes    Alcohol/week: 2.0 standard drinks    Types: 2 Glasses of wine per week    Comment: socially   Drug use: No   Sexual activity: Yes    Partners:  Male    Birth control/protection: Surgical, Post-menopausal  Other Topics Concern   Not on file  Social History Narrative   Not on file   Social Determinants of Health   Financial Resource Strain: Not on file  Food Insecurity: Not on file  Transportation Needs: Not on file  Physical Activity: Not on file  Stress: Not on file  Social Connections: Not on file  Intimate Partner Violence: Not on file    Outpatient Medications Prior to Visit  Medication Sig Dispense Refill   acetaminophen (TYLENOL) 500 MG tablet Take 1,000 mg by mouth every 6 (six) hours as needed for moderate pain or headache. Pain     ALPRAZolam (XANAX) 0.5 MG tablet Take 0.5-1 tablets (0.25-0.5 mg total) by mouth as needed for anxiety (panic attack). anxiety 30 tablet 0   aspirin EC 81 MG tablet Take 81 mg by mouth daily.     B Complex-Biotin-FA (B-COMPLEX PO) Take 1 capsule by mouth daily.     buPROPion (WELLBUTRIN XL) 300 MG 24 hr tablet Take 1  tablet (300 mg total) by mouth daily. 90 tablet 1   cetirizine-pseudoephedrine (ZYRTEC-D) 5-120 MG per tablet Take 1 tablet by mouth daily as needed for allergies.     Cholecalciferol (VITAMIN D3) 5000 units CAPS Take 1 capsule (5,000 Units total) by mouth daily. 30 capsule    ibuprofen (ADVIL,MOTRIN) 200 MG tablet Take 400 mg by mouth every 6 (six) hours as needed. Pain     Multiple Vitamin (MULTIVITAMIN) tablet Take 1 tablet by mouth daily.     tinidazole (TINDAMAX) 500 MG tablet Take 2 tabs po bid x 2 days. 8 tablet 0   escitalopram (LEXAPRO) 10 MG tablet Take 0.5 tablets (5 mg total) by mouth daily. 30 tablet 1   No facility-administered medications prior to visit.    No Known Allergies  Review of Systems  Constitutional:  Positive for fatigue. Negative for activity change, chills and fever.  HENT:  Negative for congestion, postnasal drip, rhinorrhea, sinus pressure and sinus pain.   Eyes: Negative.   Respiratory:  Negative for cough, chest tightness and wheezing.    Cardiovascular:  Negative for chest pain and palpitations.  Gastrointestinal:  Negative for constipation, diarrhea, nausea and vomiting.  Musculoskeletal:  Negative for back pain and myalgias.  Skin:  Positive for rash.       Multiple blisterlike lesions on the legs, on the arms, on the face, and on the hips.   Allergic/Immunologic: Negative.   Neurological:  Negative for dizziness, weakness and headaches.  Psychiatric/Behavioral:  Positive for sleep disturbance. The patient is not nervous/anxious.       Objective:    Physical Exam Vitals and nursing note reviewed.  Constitutional:      General: She is in acute distress.     Appearance: Normal appearance. She is well-developed.  HENT:     Head: Normocephalic.     Nose: Nose normal.  Eyes:     Pupils: Pupils are equal, round, and reactive to light.  Cardiovascular:     Rate and Rhythm: Normal rate and regular rhythm.     Pulses: Normal pulses.     Heart sounds: Normal heart sounds.  Pulmonary:     Effort: Pulmonary effort is normal.     Breath sounds: Normal breath sounds.  Abdominal:     Palpations: Abdomen is soft.  Musculoskeletal:        General: Normal range of motion.     Cervical back: Normal range of motion and neck supple.  Lymphadenopathy:     Cervical: No cervical adenopathy.  Skin:    General: Skin is warm and dry.     Capillary Refill: Capillary refill takes less than 2 seconds.     Comments: Blisterlike lesions widely dispersed over the lower legs, hands, forearms, neck, and hips.  Many are scabbed.  Localized erythema present.  Small amount of serous fluid draining from some of the lesions.  Lesions: Great deal of discomfort.  Neurological:     General: No focal deficit present.     Mental Status: She is alert and oriented to person, place, and time.  Psychiatric:        Mood and Affect: Mood normal.        Behavior: Behavior normal.        Thought Content: Thought content normal.        Judgment:  Judgment normal.    Today's Vitals   06/09/21 0946  BP: 100/68  Pulse: 81  Temp: (!) 96.1 F (35.6 C)  SpO2: 98%  Weight: 191 lb 6.4 oz (86.8 kg)  Height: 5\' 6"  (1.676 m)   Body mass index is 30.89 kg/m.   Wt Readings from Last 3 Encounters:  06/14/21 190 lb 9.6 oz (86.5 kg)  06/09/21 191 lb 6.4 oz (86.8 kg)  05/03/21 189 lb (85.7 kg)    Health Maintenance Due  Topic Date Due   COVID-19 Vaccine (1) Never done   MAMMOGRAM  Never done   Zoster Vaccines- Shingrix (1 of 2) Never done   INFLUENZA VACCINE  06/19/2021    There are no preventive care reminders to display for this patient.   Lab Results  Component Value Date   TSH 0.853 09/12/2020   Lab Results  Component Value Date   WBC 4.0 09/12/2020   HGB 12.2 09/12/2020   HCT 36.5 09/12/2020   MCV 89 09/12/2020   PLT 191 09/12/2020   Lab Results  Component Value Date   NA 142 09/12/2020   K 4.0 09/12/2020   CO2 23 09/12/2020   GLUCOSE 88 09/12/2020   BUN 17 09/12/2020   CREATININE 0.77 09/12/2020   BILITOT 0.2 09/12/2020   ALKPHOS 101 09/12/2020   AST 16 09/12/2020   ALT 17 09/12/2020   PROT 7.0 09/12/2020   ALBUMIN 4.3 09/12/2020   CALCIUM 8.7 09/12/2020   Lab Results  Component Value Date   CHOL 174 09/12/2020   Lab Results  Component Value Date   HDL 51 09/12/2020   Lab Results  Component Value Date   LDLCALC 105 (H) 09/12/2020   Lab Results  Component Value Date   TRIG 100 09/12/2020   Lab Results  Component Value Date   CHOLHDL 3.4 09/12/2020   Lab Results  Component Value Date   HGBA1C 5.1 09/12/2020       Assessment & Plan:  1. Rash and nonspecific skin eruption Start Medrol taper.  Take by mouth as directed for 6 days.  Use triamcinolone 0.1% ointment twice daily to help all affected lesions.  Advised patient to notify office if symptoms worsen or do not get better over the next 3 to 5 days. - triamcinolone ointment (KENALOG) 0.1 %; Apply 1 application topically 2 (two)  times daily.  Dispense: 80 g; Refill: 2   Problem List Items Addressed This Visit       Musculoskeletal and Integument   Rash and nonspecific skin eruption - Primary   Relevant Medications   triamcinolone ointment (KENALOG) 0.1 %     Meds ordered this encounter  Medications   triamcinolone ointment (KENALOG) 0.1 %    Sig: Apply 1 application topically 2 (two) times daily.    Dispense:  80 g    Refill:  2    Order Specific Question:   Supervising Provider    Answer:   09/14/2020 D [[id number]]   DISCONTD: methylPREDNISolone (MEDROL) 4 MG TBPK tablet    Sig: Take by mouth as directed for 6 days    Dispense:  21 tablet    Refill:  0    Order Specific Question:   Supervising Provider    Answer:   Nani Gasser D [[id number]]   This note was dictated using Dragon Voice Recognition Software. Rapid proofreading was performed to expedite the delivery of the information. Despite proofreading, phonetic errors will occur which are common with this voice recognition software. Please take this into consideration. If there are any concerns, please contact our office.    Nani Gasser, NP

## 2021-06-12 ENCOUNTER — Telehealth: Payer: Self-pay | Admitting: Physician Assistant

## 2021-06-12 ENCOUNTER — Other Ambulatory Visit: Payer: Self-pay | Admitting: Nurse Practitioner

## 2021-06-12 DIAGNOSIS — R21 Rash and other nonspecific skin eruption: Secondary | ICD-10-CM

## 2021-06-12 DIAGNOSIS — L299 Pruritus, unspecified: Secondary | ICD-10-CM

## 2021-06-12 MED ORDER — HYDROXYZINE HCL 10 MG PO TABS: ORAL_TABLET | ORAL | 0 refills | Status: DC

## 2021-06-12 NOTE — Telephone Encounter (Signed)
Patient is aware of the below and verbalized understanding. AS, CMA 

## 2021-06-12 NOTE — Progress Notes (Signed)
Sent hydroxyzine 10mg  tablets . May take 1 to 2 tablets as needed for acute itching. This was sent to [facility]

## 2021-06-12 NOTE — Telephone Encounter (Signed)
Error

## 2021-06-12 NOTE — Telephone Encounter (Signed)
Patient was seen Friday for poison ivy and states you were going to send her in something for itching. She declined at the time but is now requesting this medication be sent to the pharmacy.    [facility]

## 2021-06-12 NOTE — Telephone Encounter (Signed)
We talked about hydroxyzine. I sent 10mg , which is very lowest dose. This will likely make her sleepy, especially since benadryl makes her sleepy. She can start with taking 1/2 tablet, but take up to two tablets twice daily as needed. No working or driving after taking this. Was sent to drugs.

## 2021-06-13 ENCOUNTER — Telehealth: Payer: Self-pay | Admitting: Physician Assistant

## 2021-06-13 NOTE — Telephone Encounter (Signed)
Patient called into office still has poison oak and it's itching really bad.  Patient wants to know if there is a shot she can get.  Please advise!!

## 2021-06-14 ENCOUNTER — Ambulatory Visit (INDEPENDENT_AMBULATORY_CARE_PROVIDER_SITE_OTHER): Payer: 59 | Admitting: Nurse Practitioner

## 2021-06-14 ENCOUNTER — Other Ambulatory Visit: Payer: Self-pay

## 2021-06-14 ENCOUNTER — Encounter: Payer: Self-pay | Admitting: Nurse Practitioner

## 2021-06-14 VITALS — BP 123/75 | HR 85 | Temp 98.0°F | Ht 66.0 in | Wt 190.6 lb

## 2021-06-14 DIAGNOSIS — R21 Rash and other nonspecific skin eruption: Secondary | ICD-10-CM

## 2021-06-14 DIAGNOSIS — B354 Tinea corporis: Secondary | ICD-10-CM | POA: Diagnosis not present

## 2021-06-14 DIAGNOSIS — L237 Allergic contact dermatitis due to plants, except food: Secondary | ICD-10-CM | POA: Diagnosis not present

## 2021-06-14 MED ORDER — METHYLPREDNISOLONE 4 MG PO TBPK
ORAL_TABLET | ORAL | 0 refills | Status: DC
Start: 2021-06-14 — End: 2022-08-10

## 2021-06-14 MED ORDER — AMOXICILLIN-POT CLAVULANATE 875-125 MG PO TABS: 1.0000 | ORAL_TABLET | Freq: Two times a day (BID) | ORAL | 0 refills | Status: DC

## 2021-06-14 MED ORDER — CLOTRIMAZOLE-BETAMETHASONE 1-0.05 % EX CREA: 1.0000 "application " | TOPICAL_CREAM | Freq: Two times a day (BID) | CUTANEOUS | 1 refills | Status: DC

## 2021-06-14 MED ORDER — METHYLPREDNISOLONE SODIUM SUCC 125 MG IJ SOLR
125.0000 mg | Freq: Once | INTRAMUSCULAR | Status: AC
  Administered 2021-06-14: 125 mg via INTRAMUSCULAR

## 2021-06-14 NOTE — Progress Notes (Signed)
Established Patient Office Visit  Subjective:  Patient ID: Stephanie PerlaChristine A Byron, female    DOB: 04/27/1966  Age: 55 y.o. MRN: [id number]  CC:  Chief Complaint  Patient presents with   Injections   Poison Ivy    HPI Stephanie Chan presents for worsening poison ivy.  Patient has completed Medrol taper.  She is using triamcinolone ointment on the affected lesions.  States this is not helping at all.  Lesions continue to spread, upper arms, onto the chest, abdomen, and hips.  More closely dispersed now.  Extremely itchy.  Trial of hydroxyzine 10 mg tablets has not been helpful either.  Itching is so severe it is keeping her awake at night.  Some of the lesions appear as though they may be getting infected.  Draining small amount of fluid.  Patient denies fever, headache, body aches or pains.  She denies nausea, vomiting, or diarrhea.  Past Medical History:  Diagnosis Date   Allergy    Bundle branch block    Depression    Migraines    Panic attack    Vertigo     Past Surgical History:  Procedure Laterality Date   CESAREAN SECTION     WISDOM TOOTH EXTRACTION      Family History  Problem Relation Age of Onset   Thyroid disease Mother 7140   Kidney disease Father 1676   Heart disease Father 4676   Healthy Sister    Soil scientistHealthy Brother    Healthy Daughter    Healthy Sister    Healthy Sister    Healthy Daughter    Healthy Daughter    Colon cancer Neg Hx    Colon polyps Neg Hx    Rectal cancer Neg Hx    Stomach cancer Neg Hx     Social History   Socioeconomic History   Marital status: Married    Spouse name: Not on file   Number of children: Not on file   Years of education: Not on file   Highest education level: Not on file  Occupational History   Not on file  Tobacco Use   Smoking status: Never   Smokeless tobacco: Never  Vaping Use   Vaping Use: Never used  Substance and Sexual Activity   Alcohol use: Yes    Alcohol/week: 2.0 standard drinks    Types: 2 Glasses  of wine per week    Comment: socially   Drug use: No   Sexual activity: Yes    Partners: Male    Birth control/protection: Surgical, Post-menopausal  Other Topics Concern   Not on file  Social History Narrative   Not on file   Social Determinants of Health   Financial Resource Strain: Not on file  Food Insecurity: Not on file  Transportation Needs: Not on file  Physical Activity: Not on file  Stress: Not on file  Social Connections: Not on file  Intimate Partner Violence: Not on file    Outpatient Medications Prior to Visit  Medication Sig Dispense Refill   acetaminophen (TYLENOL) 500 MG tablet Take 1,000 mg by mouth every 6 (six) hours as needed for moderate pain or headache. Pain     ALPRAZolam (XANAX) 0.5 MG tablet Take 0.5-1 tablets (0.25-0.5 mg total) by mouth as needed for anxiety (panic attack). anxiety 30 tablet 0   aspirin EC 81 MG tablet Take 81 mg by mouth daily.     B Complex-Biotin-FA (B-COMPLEX PO) Take 1 capsule by mouth daily.  buPROPion (WELLBUTRIN XL) 300 MG 24 hr tablet Take 1 tablet (300 mg total) by mouth daily. 90 tablet 1   cetirizine-pseudoephedrine (ZYRTEC-D) 5-120 MG per tablet Take 1 tablet by mouth daily as needed for allergies.     Cholecalciferol (VITAMIN D3) 5000 units CAPS Take 1 capsule (5,000 Units total) by mouth daily. 30 capsule    escitalopram (LEXAPRO) 10 MG tablet TAKE 1 TABLET BY MOUTH DAILY. 30 tablet 0   hydrOXYzine (ATARAX/VISTARIL) 10 MG tablet Take 1 t o2 tablets po BID prn itching. 45 tablet 0   ibuprofen (ADVIL,MOTRIN) 200 MG tablet Take 400 mg by mouth every 6 (six) hours as needed. Pain     Multiple Vitamin (MULTIVITAMIN) tablet Take 1 tablet by mouth daily.     tinidazole (TINDAMAX) 500 MG tablet Take 2 tabs po bid x 2 days. 8 tablet 0   triamcinolone ointment (KENALOG) 0.1 % Apply 1 application topically 2 (two) times daily. 80 g 2   methylPREDNISolone (MEDROL) 4 MG TBPK tablet Take by mouth as directed for 6 days 21 tablet 0    No facility-administered medications prior to visit.    No Known Allergies  ROS Review of Systems  Constitutional:  Positive for fatigue. Negative for activity change, chills and fever.  HENT:  Negative for congestion, postnasal drip, rhinorrhea, sinus pressure and sinus pain.   Eyes: Negative.   Respiratory:  Negative for cough, chest tightness and wheezing.   Cardiovascular:  Negative for chest pain and palpitations.  Gastrointestinal:  Negative for constipation, diarrhea, nausea and vomiting.  Endocrine: Negative.   Musculoskeletal:  Negative for back pain and myalgias.  Skin:  Positive for rash.       Multiple blisterlike lesions on the legs, on the arms, on the face, and on the hips.  Lesions are spreading up the legs of the arms, onto the chest and abdomen, and hips.  Lesions are more closely dispersed.  Some are scabbed.  Some are draining small amount of fluid.  All lesions are extremely itchy.  Allergic/Immunologic: Negative.   Neurological:  Negative for dizziness, weakness and headaches.  Psychiatric/Behavioral:  Positive for sleep disturbance. The patient is not nervous/anxious.      Objective:    Physical Exam Vitals and nursing note reviewed.  Constitutional:      General: She is in acute distress.     Appearance: Normal appearance. She is well-developed.  HENT:     Head: Normocephalic and atraumatic.     Nose: Nose normal.  Eyes:     Conjunctiva/sclera: Conjunctivae normal.     Pupils: Pupils are equal, round, and reactive to light.  Cardiovascular:     Rate and Rhythm: Normal rate and regular rhythm.     Pulses: Normal pulses.     Heart sounds: Normal heart sounds.  Pulmonary:     Effort: Pulmonary effort is normal.     Breath sounds: Normal breath sounds.  Abdominal:     Palpations: Abdomen is soft.  Musculoskeletal:        General: Normal range of motion.     Cervical back: Normal range of motion and neck supple.  Lymphadenopathy:     Cervical: No  cervical adenopathy.  Skin:    General: Skin is warm and dry.     Capillary Refill: Capillary refill takes less than 2 seconds.          Comments: Blisterlike lesions widely dispersed over the lower legs, hands, forearms, neck, and hips.  Many are  scabbed.  Localized erythema present.  Some lesions appear to be draining serosanguineous fluid.  Lesions are becoming more closely spaced together.  Neurological:     General: No focal deficit present.     Mental Status: She is alert and oriented to person, place, and time.  Psychiatric:        Mood and Affect: Mood normal.        Behavior: Behavior normal.        Thought Content: Thought content normal.        Judgment: Judgment normal.    Today's Vitals   06/14/21 1148  BP: 123/75  Pulse: 85  Temp: 98 F (36.7 C)  SpO2: 98%  Weight: 190 lb 9.6 oz (86.5 kg)  Height: 5\' 6"  (1.676 m)   Body mass index is 30.76 kg/m.   Wt Readings from Last 3 Encounters:  06/14/21 190 lb 9.6 oz (86.5 kg)  06/09/21 191 lb 6.4 oz (86.8 kg)  05/03/21 189 lb (85.7 kg)     Health Maintenance Due  Topic Date Due   COVID-19 Vaccine (1) Never done   MAMMOGRAM  Never done   Zoster Vaccines- Shingrix (1 of 2) Never done   INFLUENZA VACCINE  06/19/2021    There are no preventive care reminders to display for this patient.  Lab Results  Component Value Date   TSH 0.853 09/12/2020   Lab Results  Component Value Date   WBC 4.0 09/12/2020   HGB 12.2 09/12/2020   HCT 36.5 09/12/2020   MCV 89 09/12/2020   PLT 191 09/12/2020   Lab Results  Component Value Date   NA 142 09/12/2020   K 4.0 09/12/2020   CO2 23 09/12/2020   GLUCOSE 88 09/12/2020   BUN 17 09/12/2020   CREATININE 0.77 09/12/2020   BILITOT 0.2 09/12/2020   ALKPHOS 101 09/12/2020   AST 16 09/12/2020   ALT 17 09/12/2020   PROT 7.0 09/12/2020   ALBUMIN 4.3 09/12/2020   CALCIUM 8.7 09/12/2020   Lab Results  Component Value Date   CHOL 174 09/12/2020   Lab Results  Component  Value Date   HDL 51 09/12/2020   Lab Results  Component Value Date   LDLCALC 105 (H) 09/12/2020   Lab Results  Component Value Date   TRIG 100 09/12/2020   Lab Results  Component Value Date   CHOLHDL 3.4 09/12/2020   Lab Results  Component Value Date   HGBA1C 5.1 09/12/2020      Assessment & Plan:  1. Rash and nonspecific skin eruption Evidence of some lesions may becoming infected.  Start Augmentin twice daily for next 10 days. Administer solu-medrol 125mg  injection in the office today. Patient tolerated well. Follow up with medrol dose pack. Take as directed for 6 days.  Patient should notify the office if symptoms worsen or do not get better over next 5 to 7 days.  She voiced understanding. - amoxicillin-clavulanate (AUGMENTIN) 875-125 MG tablet; Take 1 tablet by mouth 2 (two) times daily.  Dispense: 20 tablet; Refill: 0 - methylPREDNISolone (MEDROL) 4 MG TBPK tablet; Take by mouth as directed for 6 days  Dispense: 21 tablet; Refill: 0 - methylPREDNISolone sodium succinate (SOLU-MEDROL) 125 mg/2 mL injection 125 mg  2. Poison ivy dermatitis Evidence of some lesions may becoming infected.  Start Augmentin twice daily for next 10 days. Administer solu-medrol 125mg  injection in the office today. Patient tolerated well. Follow up with medrol dose pack. Take as directed for 6 days.  Patient  should notify the office if symptoms worsen or do not get better over next 5 to 7 days.  She voiced understanding.  Recommend use of Zanfel OTC wash her poison ivy/poison oak.  She should follow instructions per package.  3. Tinea corporis Start Lotrisone cream.  Apply twice daily to both affected areas twice daily for next 10 days and then as needed. - clotrimazole-betamethasone (LOTRISONE) cream; Apply 1 application topically 2 (two) times daily.  Dispense: 45 g; Refill: 1 - methylPREDNISolone sodium succinate (SOLU-MEDROL) 125 mg/2 mL injection 125 mg   Problem List Items Addressed This Visit        Musculoskeletal and Integument   Rash and nonspecific skin eruption - Primary   Relevant Medications   amoxicillin-clavulanate (AUGMENTIN) 875-125 MG tablet   methylPREDNISolone (MEDROL) 4 MG TBPK tablet   Poison ivy dermatitis   Tinea corporis   Relevant Medications   clotrimazole-betamethasone (LOTRISONE) cream    Meds ordered this encounter  Medications   amoxicillin-clavulanate (AUGMENTIN) 875-125 MG tablet    Sig: Take 1 tablet by mouth 2 (two) times daily.    Dispense:  20 tablet    Refill:  0    Order Specific Question:   Supervising Provider    Answer:   Nani Gasser D [[id number]]   clotrimazole-betamethasone (LOTRISONE) cream    Sig: Apply 1 application topically 2 (two) times daily.    Dispense:  45 g    Refill:  1    Order Specific Question:   Supervising Provider    Answer:   Nani Gasser D [[id number]]   methylPREDNISolone (MEDROL) 4 MG TBPK tablet    Sig: Take by mouth as directed for 6 days    Dispense:  21 tablet    Refill:  0    Order Specific Question:   Supervising Provider    Answer:   Nani Gasser D [[id number]]   methylPREDNISolone sodium succinate (SOLU-MEDROL) 125 mg/2 mL injection 125 mg    Follow-up: Return for prn worsening or persistent symptoms.   This note was dictated using Conservation officer, historic buildings. Rapid proofreading was performed to expedite the delivery of the information. Despite proofreading, phonetic errors will occur which are common with this voice recognition software. Please take this into consideration. If there are any concerns, please contact our office.    Stephanie Jews, NP

## 2021-06-14 NOTE — Telephone Encounter (Signed)
We can have her come in for Solu-Medrol injection.

## 2021-06-15 NOTE — Telephone Encounter (Signed)
Patient came in 06/14/2021 for solu-medrol injection and spoke with PCP.

## 2021-06-23 DIAGNOSIS — R21 Rash and other nonspecific skin eruption: Secondary | ICD-10-CM | POA: Insufficient documentation

## 2021-06-25 DIAGNOSIS — L237 Allergic contact dermatitis due to plants, except food: Secondary | ICD-10-CM | POA: Insufficient documentation

## 2021-06-25 DIAGNOSIS — B354 Tinea corporis: Secondary | ICD-10-CM | POA: Insufficient documentation

## 2021-09-02 ENCOUNTER — Other Ambulatory Visit: Payer: Self-pay | Admitting: Physician Assistant

## 2021-09-02 DIAGNOSIS — F4323 Adjustment disorder with mixed anxiety and depressed mood: Secondary | ICD-10-CM

## 2021-09-02 DIAGNOSIS — F32A Depression, unspecified: Secondary | ICD-10-CM

## 2021-10-06 ENCOUNTER — Encounter (HOSPITAL_COMMUNITY): Payer: Self-pay | Admitting: Emergency Medicine

## 2021-10-06 ENCOUNTER — Emergency Department (HOSPITAL_COMMUNITY): Payer: 59

## 2021-10-06 ENCOUNTER — Emergency Department (HOSPITAL_COMMUNITY)
Admission: EM | Admit: 2021-10-06 | Discharge: 2021-10-07 | Disposition: A | Discharge status: Home / Self Care | Payer: 59 | Attending: Emergency Medicine | Admitting: Emergency Medicine

## 2021-10-06 ENCOUNTER — Other Ambulatory Visit: Payer: Self-pay

## 2021-10-06 DIAGNOSIS — R079 Chest pain, unspecified: Secondary | ICD-10-CM | POA: Insufficient documentation

## 2021-10-06 DIAGNOSIS — M79622 Pain in left upper arm: Secondary | ICD-10-CM

## 2021-10-06 DIAGNOSIS — M25512 Pain in left shoulder: Secondary | ICD-10-CM | POA: Diagnosis not present

## 2021-10-06 LAB — COMPREHENSIVE METABOLIC PANEL
ALT: 19 U/L (ref 0–44)
AST: 18 U/L (ref 15–41)
Albumin: 4 g/dL (ref 3.5–5.0)
Alkaline Phosphatase: 97 U/L (ref 38–126)
Anion gap: 8 (ref 5–15)
BUN: 19 mg/dL (ref 6–20)
CO2: 25 mmol/L (ref 22–32)
Calcium: 9.1 mg/dL (ref 8.9–10.3)
Chloride: 104 mmol/L (ref 98–111)
Creatinine, Ser: 0.73 mg/dL (ref 0.44–1.00)
GFR, Estimated: >60 mL/min (ref >60)
Glucose, Bld: 101 mg/dL — ABNORMAL HIGH (ref 70–99)
Potassium: 4.1 mmol/L (ref 3.5–5.1)
Sodium: 137 mmol/L (ref 135–145)
Total Bilirubin: 0.6 mg/dL (ref 0.3–1.2)
Total Protein: 7.1 g/dL (ref 6.5–8.1)

## 2021-10-06 LAB — CBC WITH DIFFERENTIAL/PLATELET
Abs Immature Granulocytes: 0.01 10*3/uL (ref 0.00–0.07)
Basophils Absolute: 0 10*3/uL (ref 0.0–0.1)
Basophils Relative: 1 %
Eosinophils Absolute: 0.1 10*3/uL (ref 0.0–0.5)
Eosinophils Relative: 2 %
HCT: 37.6 % (ref 36.0–46.0)
Hemoglobin: 12.9 g/dL (ref 12.0–15.0)
Immature Granulocytes: 0 %
Lymphocytes Relative: 44 %
Lymphs Abs: 2 10*3/uL (ref 0.7–4.0)
MCH: 30.7 pg (ref 26.0–34.0)
MCHC: 34.3 g/dL (ref 30.0–36.0)
MCV: 89.5 fL (ref 80.0–100.0)
Monocytes Absolute: 0.4 10*3/uL (ref 0.1–1.0)
Monocytes Relative: 8 %
Neutro Abs: 2.1 10*3/uL (ref 1.7–7.7)
Neutrophils Relative %: 45 %
Platelets: 206 10*3/uL (ref 150–400)
RBC: 4.2 MIL/uL (ref 3.87–5.11)
RDW: 12 % (ref 11.5–15.5)
WBC: 4.6 10*3/uL (ref 4.0–10.5)
nRBC: 0 % (ref 0.0–0.2)

## 2021-10-06 LAB — TROPONIN I (HIGH SENSITIVITY)
Troponin I (High Sensitivity): 3 ng/L (ref <18)
Troponin I (High Sensitivity): 3 ng/L (ref <18)

## 2021-10-06 NOTE — ED Provider Notes (Signed)
Emergency Medicine Provider Triage Evaluation Note  Stephanie Chan , a 55 y.o. female  was evaluated in triage.  Pt complains of chest pain.  Pain is located to left chest and radiates to neck and left arm.  Patient describes pain as a stabbing.  Pain started 0 300 this morning.  Patient denies any associated nausea, vomiting, diaphoresis, or shortness of breath.  No aggravating factors.  Pain improved after receiving nitroglycerin x2 with EMS.    Review of Systems  Positive: Chest pain Negative: Nausea, vomiting, abdominal pain, diaphoresis, shortness of breath, leg swelling or tenderness, palpitations, syncope  Physical Exam  BP 133/84 (BP Location: Left Arm)   Pulse 71   Temp 98.8 F (37.1 C)   Resp 16   SpO2 98%  Gen:   Awake, no distress   Resp:  Normal effort, lungs clear to auscultation bilaterally MSK:   Moves extremities without difficulty; no swelling or tenderness to bilateral lower extremities Other:  No murmurs rubs or gallops.  +2 radial pulse bilaterally.  Abdomen soft, nondistended, nontender.  Medical Decision Making  Medically screening exam initiated at 6:30 PM.  Appropriate orders placed.  Stephanie Chan was informed that the remainder of the evaluation will be completed by another provider, this initial triage assessment does not replace that evaluation, and the importance of remaining in the ED until their evaluation is complete.     Haskel Schroeder, PA-C 10/06/21 Stephanie Chan    Stephanie Berkshire, MD 10/06/21 2210

## 2021-10-06 NOTE — ED Triage Notes (Addendum)
Per EMS pt with left sided pain under breast that began about 0300 this morning. Went to fire station and EMS was called.  NSR with LBB which was pre existing per patient.  No n/v/d or shob.  Had 2 nitro which helped her pain en route. 324 of ASA given.  VSS 18 G IV in place.

## 2021-10-07 NOTE — Discharge Instructions (Signed)
I don't think this pain is related to your heart at this time. Please try some topical treatments such as voltaren gel or lidocaine patches and see if it helps. If you start developing swelling, spreading pain or redness in that arm please follow up with your primary provider or return here to ensure no blood clot. If you have persistent symptoms, please follow up with your primary provider for further evaluation.

## 2021-10-08 NOTE — ED Provider Notes (Signed)
[facility] EMERGENCY DEPARTMENT Provider Note   CSN: [id number] Arrival date & time: 10/06/21  1816     History Chief Complaint  Patient presents with   Chest Pain    Stephanie Chan is a 55 y.o. female.  This is a 55 year old female who presents emerged from today with left anterior shoulder pain.  She states that seems to radiate up a little bit from her left chest.  Is concerned her for heart attack so she presents emerged department.  She states that she initially went to the fire department and told him that she had a left bundle branch block but when they did her EKG it looked abnormal to them so called ambulance who brought her here.  She is been waiting room for approximately 11 hours and during that time her pain is improved quite a bit but has not gone away.  She is figured out that it is actually tender to palpation.  Does not seem to be worse with movement.  She has no associated shortness of breath, nausea, vomiting, lightheadedness, diaphoresis or other associated symptoms.  She has no history of a similar pain.  She did not do anything in particular that she can think of that may have caused this discomfort.  No history of early cardiac disease in her family, hypertension, hyperlipidemia, diabetes for her.  No lower extremity swelling, recent surgeries, long car rides, estrogen use or history of DVT/pulmonary embolus.   Chest Pain     Past Medical History:  Diagnosis Date   Allergy    Bundle branch block    Depression    Migraines    Panic attack    Vertigo     Patient Active Problem List   Diagnosis Date Noted   Poison ivy dermatitis 06/25/2021   Tinea corporis 06/25/2021   Rash and nonspecific skin eruption 06/23/2021   Sleeping difficulties 02/12/2019   Panic anxiety syndrome 10/06/2018   Olecranon bursitis of right elbow 10/06/2018   Personal history of noncompliance with medical treatment and regimen 10/06/2018   Encounter for  wellness examination 10/22/2017   Counseling on health promotion and disease prevention 10/27/2016   Vitamin D insufficiency 10/05/2016   Perimenopausal hot flashes.  08/08/2016   BMI 30.0-30.9,adult 08/08/2016   Depression 08/08/2016   Adjustment disorder with mixed anxiety and depressed mood 08/08/2016   Environmental and seasonal allergies 08/08/2016   History of migraines 08/08/2016    Past Surgical History:  Procedure Laterality Date   CESAREAN SECTION     WISDOM TOOTH EXTRACTION       OB History   No obstetric history on file.     Family History  Problem Relation Age of Onset   Thyroid disease Mother 82   Kidney disease Father 30   Heart disease Father 49   Healthy Sister    Soil scientist Daughter    Healthy Sister    Healthy Sister    Healthy Daughter    Healthy Daughter    Colon cancer Neg Hx    Colon polyps Neg Hx    Rectal cancer Neg Hx    Stomach cancer Neg Hx     Social History   Tobacco Use   Smoking status: Never   Smokeless tobacco: Never  Vaping Use   Vaping Use: Never used  Substance Use Topics   Alcohol use: Yes    Alcohol/week: 2.0 standard drinks    Types: 2 Glasses of wine per  week    Comment: socially   Drug use: No    Home Medications Prior to Admission medications   Medication Sig Start Date End Date Taking? Authorizing Provider  acetaminophen (TYLENOL) 500 MG tablet Take 1,000 mg by mouth every 6 (six) hours as needed for moderate pain or headache. Pain    [provider]  ALPRAZolam (XANAX) 0.5 MG tablet Take 0.5-1 tablets (0.25-0.5 mg total) by mouth as needed for anxiety (panic attack). anxiety 03/10/20   Mayer Masker, PA-C  amoxicillin-clavulanate (AUGMENTIN) 875-125 MG tablet Take 1 tablet by mouth 2 (two) times daily. 06/14/21   Carlean Jews, NP  aspirin EC 81 MG tablet Take 81 mg by mouth daily.    [provider]  B Complex-Biotin-FA (B-COMPLEX PO) Take 1 capsule by mouth daily.     [provider]  buPROPion (WELLBUTRIN XL) 300 MG 24 hr tablet Take 1 tablet (300 mg total) by mouth daily. 05/03/21   Mayer Masker, PA-C  cetirizine-pseudoephedrine (ZYRTEC-D) 5-120 MG per tablet Take 1 tablet by mouth daily as needed for allergies.    [provider]  Cholecalciferol (VITAMIN D3) 5000 units CAPS Take 1 capsule (5,000 Units total) by mouth daily. 04/09/17   Opalski, Gavin Pound, DO  clotrimazole-betamethasone (LOTRISONE) cream Apply 1 application topically 2 (two) times daily. 06/14/21   Carlean Jews, NP  escitalopram (LEXAPRO) 10 MG tablet TAKE 1 TABLET BY MOUTH DAILY. 09/04/21   Mayer Masker, PA-C  hydrOXYzine (ATARAX/VISTARIL) 10 MG tablet Take 1 t o2 tablets po BID prn itching. 06/12/21   Carlean Jews, NP  ibuprofen (ADVIL,MOTRIN) 200 MG tablet Take 400 mg by mouth every 6 (six) hours as needed. Pain    [provider]  methylPREDNISolone (MEDROL) 4 MG TBPK tablet Take by mouth as directed for 6 days 06/14/21   Carlean Jews, NP  Multiple Vitamin (MULTIVITAMIN) tablet Take 1 tablet by mouth daily.    [provider]  tinidazole (TINDAMAX) 500 MG tablet Take 2 tabs po bid x 2 days. 01/25/21   Genia Del, MD  triamcinolone ointment (KENALOG) 0.1 % Apply 1 application topically 2 (two) times daily. 06/09/21   Carlean Jews, NP    Allergies    Patient has no known allergies.  Review of Systems   Review of Systems  Cardiovascular:  Positive for chest pain.  All other systems reviewed and are negative.  Physical Exam Updated Vital Signs BP 129/73 (BP Location: Right Arm)   Pulse 82   Temp 97.6 F (36.4 C) (Oral)   Resp 20   SpO2 100%   Physical Exam Vitals and nursing note reviewed.  Constitutional:      Appearance: She is well-developed.  HENT:     Head: Normocephalic and atraumatic.     Mouth/Throat:     Mouth: Mucous membranes are moist.     Pharynx: Oropharynx is clear.  Eyes:     Pupils: Pupils are  equal, round, and reactive to light.  Cardiovascular:     Rate and Rhythm: Normal rate and regular rhythm.  Pulmonary:     Effort: Pulmonary effort is normal. No respiratory distress.     Breath sounds: No stridor.  Chest:     Chest wall: No mass or tenderness.  Abdominal:     General: There is no distension or abdominal bruit.  Musculoskeletal:        General: Normal range of motion.     Cervical back: Normal range of motion.  Comments: Tenderness over area of bicep tendon in anterior shoulder.   Skin:    General: Skin is warm and dry.  Neurological:     General: No focal deficit present.     Mental Status: She is alert.    ED Results / Procedures / Treatments   Labs (all labs ordered are listed, but only abnormal results are displayed) Labs Reviewed  COMPREHENSIVE METABOLIC PANEL - Abnormal; Notable for the following components:      Result Value   Glucose, Bld 101 (*)    All other components within normal limits  CBC WITH DIFFERENTIAL/PLATELET  TROPONIN I (HIGH SENSITIVITY)  TROPONIN I (HIGH SENSITIVITY)    EKG EKG Interpretation  Date/Time:  Friday October 06 2021 18:22:22 EST Ventricular Rate:  73 PR Interval:  166 QRS Duration: 146 QT Interval:  414 QTC Calculation: 456 R Axis:   -7 Text Interpretation: Normal sinus rhythm Left bundle branch block Abnormal ECG Confirmed by Marily Memos [phone]) on 10/07/2021 5:17:47 AM  Radiology DG Chest 2 View  Result Date: 10/06/2021 CLINICAL DATA:  Chest pain radiating to the left arm. EXAM: CHEST - 2 VIEW COMPARISON:  None. FINDINGS: The heart size and mediastinal contours are within normal limits. Both lungs are clear. The visualized skeletal structures are unremarkable. IMPRESSION: No active cardiopulmonary disease. Electronically Signed   By: Aram Candela M.D.   On: 10/06/2021 19:08    Procedures Procedures   Medications Ordered in ED Medications - No data to display  ED Course  I have reviewed the  triage vital signs and the nursing notes.  Pertinent labs & imaging results that were available during my care of the patient were reviewed by me and considered in my medical decision making (see chart for details).    MDM Rules/Calculators/A&P                         Suspect this is musculoskeletal in nature, possibly bicep tendinitis.  I have very low suspicion for PE, pneumonia, fracture, ACS or any other emergent pathology at this time.  I did not see a need for any further work-up the patient was satisfied it was her heart.  She did have some slightly elevated blood pressures earlier in the day but at this time and the whole time she has been here they have been within normal range.  At this point I feel she is stable for discharge with PCP follow-up.   Final Clinical Impression(s) / ED Diagnoses Final diagnoses:  Left axillary pain    Rx / DC Orders ED Discharge Orders     None        Charlisa Cham, Barbara Cower, MD 10/08/21 [phone]

## 2021-12-02 IMAGING — CR DG CHEST 2V
2 series · 2 of 2 positions shown · non-contrast
Comparison: None.

CLINICAL DATA: Chest pain radiating to the left arm.

EXAM:
CHEST - 2 VIEW

[chest pa]
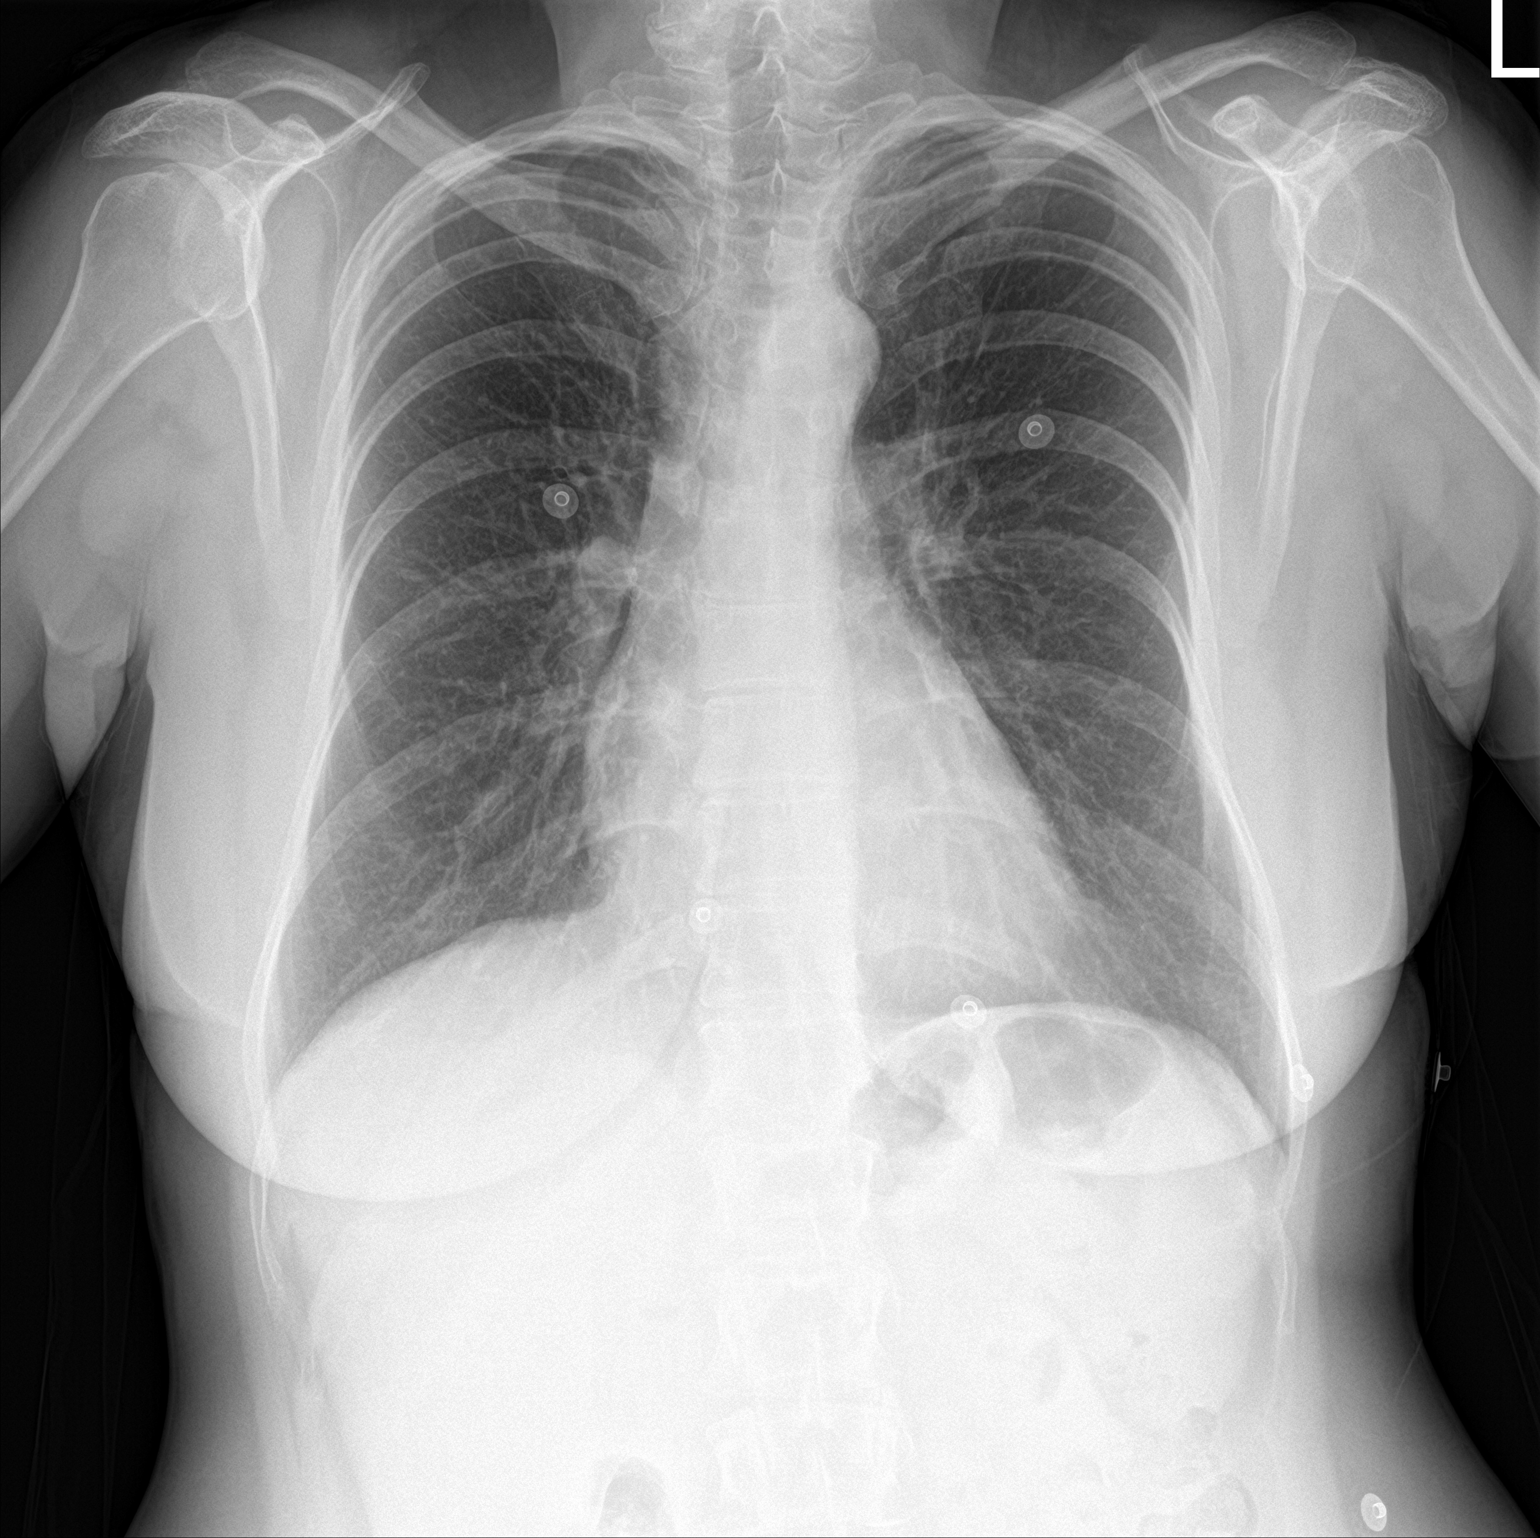

[chest lat]
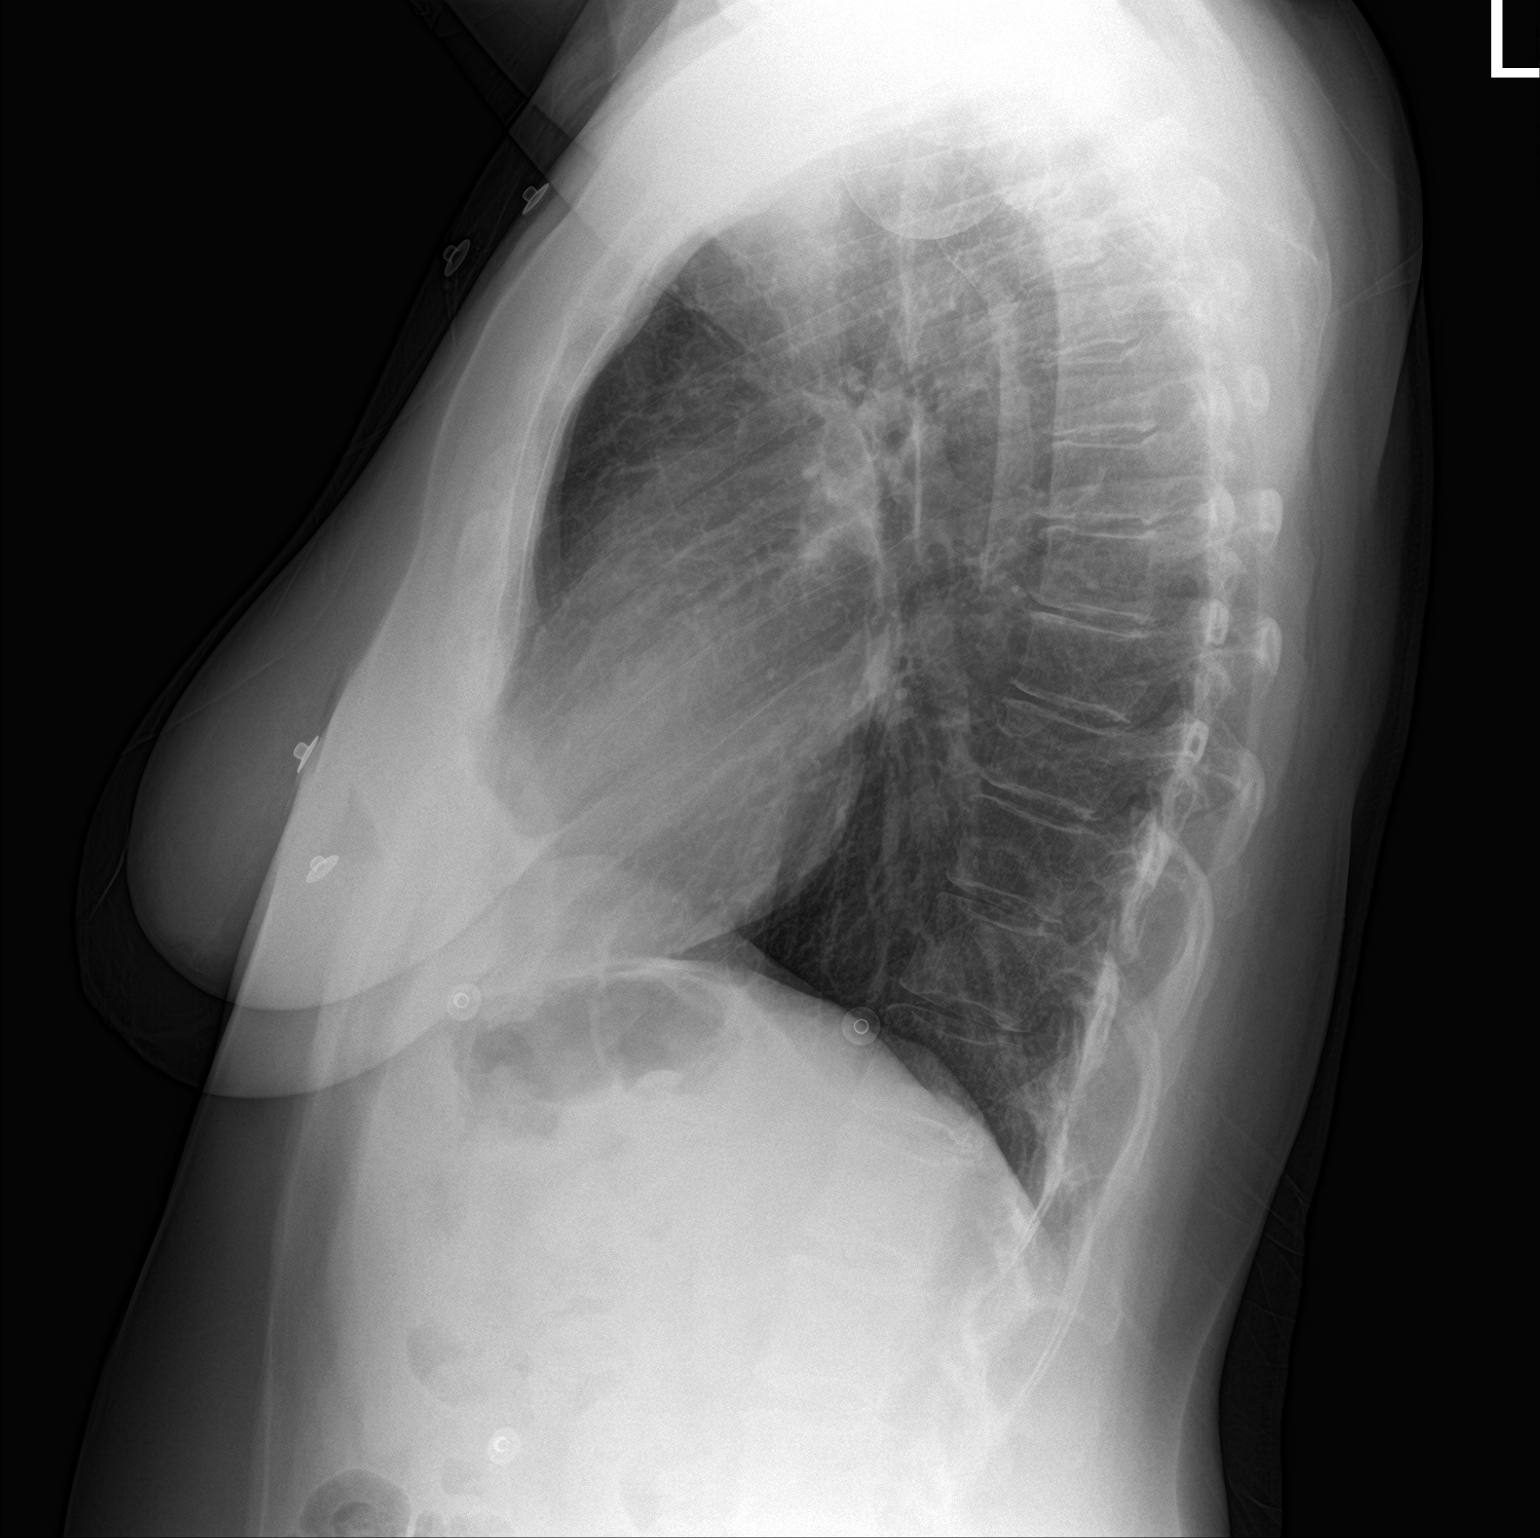

[2 of 2 positions shown; findings below may reference images not displayed]

FINDINGS: The heart size and mediastinal contours are within normal limits.
Both lungs are clear. The visualized skeletal structures are
unremarkable.
IMPRESSION: No active cardiopulmonary disease.

## 2021-12-04 ENCOUNTER — Other Ambulatory Visit: Payer: Self-pay | Admitting: Physician Assistant

## 2021-12-04 DIAGNOSIS — F4323 Adjustment disorder with mixed anxiety and depressed mood: Secondary | ICD-10-CM

## 2021-12-04 DIAGNOSIS — F32A Depression, unspecified: Secondary | ICD-10-CM

## 2021-12-13 ENCOUNTER — Encounter: Payer: Self-pay | Admitting: Physician Assistant

## 2022-01-09 ENCOUNTER — Other Ambulatory Visit: Payer: Self-pay | Admitting: Nurse Practitioner

## 2022-01-09 DIAGNOSIS — Z87898 Personal history of other specified conditions: Secondary | ICD-10-CM

## 2022-01-09 DIAGNOSIS — R112 Nausea with vomiting, unspecified: Secondary | ICD-10-CM

## 2022-01-09 MED ORDER — SCOPOLAMINE 1 MG/3DAYS TD PT72: 1.0000 | MEDICATED_PATCH | TRANSDERMAL | 0 refills | Status: DC

## 2022-01-09 NOTE — Progress Notes (Deleted)
°  Established patient acute visit   Patient: Stephanie Chan   DOB: January 09, 1966   56 y.o. Female  MRN: [id number] Visit Date: 01/10/2022  No chief complaint on file.  Subjective    HPI  ***    Medications: Outpatient Medications Prior to Visit  Medication Sig   acetaminophen (TYLENOL) 500 MG tablet Take 1,000 mg by mouth every 6 (six) hours as needed for moderate pain or headache. Pain   ALPRAZolam (XANAX) 0.5 MG tablet Take 0.5-1 tablets (0.25-0.5 mg total) by mouth as needed for anxiety (panic attack). anxiety   amoxicillin-clavulanate (AUGMENTIN) 875-125 MG tablet Take 1 tablet by mouth 2 (two) times daily.   aspirin EC 81 MG tablet Take 81 mg by mouth daily.   B Complex-Biotin-FA (B-COMPLEX PO) Take 1 capsule by mouth daily.   buPROPion (WELLBUTRIN XL) 300 MG 24 hr tablet Take 1 tablet (300 mg total) by mouth daily.   cetirizine-pseudoephedrine (ZYRTEC-D) 5-120 MG per tablet Take 1 tablet by mouth daily as needed for allergies.   Cholecalciferol (VITAMIN D3) 5000 units CAPS Take 1 capsule (5,000 Units total) by mouth daily.   clotrimazole-betamethasone (LOTRISONE) cream Apply 1 application topically 2 (two) times daily.   escitalopram (LEXAPRO) 10 MG tablet Take 1 tablet (10 mg total) by mouth daily. **PLEASE CONTACT OUR OFFICE TO SCHEDULE A FOLLOW UP FOR FUTURE MED REFILLS**   hydrOXYzine (ATARAX/VISTARIL) 10 MG tablet Take 1 t o2 tablets po BID prn itching.   ibuprofen (ADVIL,MOTRIN) 200 MG tablet Take 400 mg by mouth every 6 (six) hours as needed. Pain   methylPREDNISolone (MEDROL) 4 MG TBPK tablet Take by mouth as directed for 6 days   Multiple Vitamin (MULTIVITAMIN) tablet Take 1 tablet by mouth daily.   tinidazole (TINDAMAX) 500 MG tablet Take 2 tabs po bid x 2 days.   triamcinolone ointment (KENALOG) 0.1 % Apply 1 application topically 2 (two) times daily.   No facility-administered medications prior to visit.    Review of Systems  {Labs   Heme   Chem   Endocrine    Serology   Results Review (optional):[id number]}   Objective    There were no vitals taken for this visit.   Physical Exam  ***  No results found for any visits on 01/10/22.  Assessment & Plan     ***   No follow-ups on file.        Lorrene Reid, PA-C  [facility] at [facility] [phone] (phone) [phone] (fax)  [place]

## 2022-01-09 NOTE — Progress Notes (Signed)
Sent rx for transderm scop patch to [facility]. Should be placed behind the ear and changed every 72 hours while on cruise.

## 2022-01-10 ENCOUNTER — Ambulatory Visit: Payer: 59 | Admitting: Physician Assistant

## 2022-02-21 ENCOUNTER — Other Ambulatory Visit: Payer: Self-pay | Admitting: Physician Assistant

## 2022-02-21 DIAGNOSIS — F4323 Adjustment disorder with mixed anxiety and depressed mood: Secondary | ICD-10-CM

## 2022-02-21 DIAGNOSIS — F410 Panic disorder [episodic paroxysmal anxiety] without agoraphobia: Secondary | ICD-10-CM

## 2022-02-21 DIAGNOSIS — F32A Depression, unspecified: Secondary | ICD-10-CM

## 2022-04-05 ENCOUNTER — Encounter: Payer: Self-pay | Admitting: Physician Assistant

## 2022-04-05 ENCOUNTER — Ambulatory Visit (INDEPENDENT_AMBULATORY_CARE_PROVIDER_SITE_OTHER): Payer: 59 | Admitting: Physician Assistant

## 2022-04-05 VITALS — BP 98/64 | HR 111 | Temp 97.7°F | Ht 66.5 in | Wt 186.0 lb

## 2022-04-05 DIAGNOSIS — F410 Panic disorder [episodic paroxysmal anxiety] without agoraphobia: Secondary | ICD-10-CM

## 2022-04-05 DIAGNOSIS — F4323 Adjustment disorder with mixed anxiety and depressed mood: Secondary | ICD-10-CM

## 2022-04-05 DIAGNOSIS — F32A Depression, unspecified: Secondary | ICD-10-CM | POA: Diagnosis not present

## 2022-04-05 MED ORDER — BUPROPION HCL ER (XL) 300 MG PO TB24: 300.0000 mg | ORAL_TABLET | Freq: Every day | ORAL | 1 refills | Status: DC

## 2022-04-05 MED ORDER — HYDROXYZINE HCL 10 MG PO TABS
ORAL_TABLET | ORAL | 1 refills | Status: DC
Start: 2022-04-05 — End: 2023-02-26

## 2022-04-05 MED ORDER — ESCITALOPRAM OXALATE 10 MG PO TABS: 5.0000 mg | ORAL_TABLET | Freq: Every day | ORAL | 1 refills | Status: DC

## 2022-04-05 NOTE — Assessment & Plan Note (Addendum)
>>  ASSESSMENT AND PLAN FOR DEPRESSION WRITTEN ON 04/05/2022  3:02 PM BY Sharnese Heath, PA-C  -PHQ-9 score of 9, higher than baseline likely secondary to running out of medications. Will provide refills of Lexapro 5 mg and Wellbutrin XL 300 mg daily. Will continue to monitor.  >>ASSESSMENT AND PLAN FOR ADJUSTMENT DISORDER WITH MIXED ANXIETY AND DEPRESSED MOOD WRITTEN ON 04/05/2022  3:02 PM BY Haja Crego, PA-C  -Stable. Provided medication refills of Lexapro 5 mg and Wellbutrin XL 300 mg. Will continue to monitor.

## 2022-04-05 NOTE — Patient Instructions (Signed)
Managing Anxiety This video describes anxiety and what can be done to manage it. To view the content, go to this web address: [email or web address]  This video will expire on: 02/06/2024. If you need access to this video following this date, please reach out to the healthcare provider who assigned it to you. This information is not intended to replace advice given to you by your health care provider. Make sure you discuss any questions you have with your health care provider. Elsevier Patient Education  2023 Elsevier Inc.  

## 2022-04-05 NOTE — Progress Notes (Signed)
Established patient visit   Patient: Stephanie Chan   DOB: 1966-11-10   56 y.o. Female  MRN: [id number] Visit Date: 04/05/2022  Chief Complaint  Patient presents with   Anxiety   Subjective    HPI  Patient presents for mood follow-up. Patient reports ran out of Lexapro last week and Wellbutrin recently. Overall mood has been stable. Patient reports noticed with  hydroxyzine she felt calm. Was given rx for itching secondary to poison ivy. No labile mood, SI or HI.     04/05/2022    2:46 PM 05/03/2021    2:50 PM 01/17/2021   12:36 PM 09/19/2020    2:50 PM 03/10/2020    2:10 PM  Depression screen PHQ 2/9  Decreased Interest 1 0 0 1 0  Down, Depressed, Hopeless 1 0 0 1 0  PHQ - 2 Score 2 0 0 2 0  Altered sleeping 1 0 1 2 1   Tired, decreased energy 0 0 0 1 1  Change in appetite 0 0 0 0 0  Feeling bad or failure about yourself  1 0 1 1 2   Trouble concentrating 2 0 1 2 1   Moving slowly or fidgety/restless 3 0 0 0 1  Suicidal thoughts 0 0 0 0 0  PHQ-9 Score 9 0 3 8 6   Difficult doing work/chores Somewhat difficult   Somewhat difficult Not difficult at all      04/05/2022    2:47 PM 05/03/2021    2:51 PM 03/10/2020    2:10 PM 10/06/2018   11:12 AM  GAD 7 : Generalized Anxiety Score  Nervous, Anxious, on Edge 1 3 1 1   Control/stop worrying 1 3 1  0  Worry too much - different things 1 2 1 1   Trouble relaxing 1 1 1 1   Restless 0 1 0 1  Easily annoyed or irritable 2 1 1 1   Afraid - awful might happen 0 0 0 1  Total GAD 7 Score 6 11 5 6   Anxiety Difficulty Somewhat difficult Not difficult at all Not difficult at all Somewhat difficult        Medications: Outpatient Medications Prior to Visit  Medication Sig   acetaminophen (TYLENOL) 500 MG tablet Take 1,000 mg by mouth every 6 (six) hours as needed for moderate pain or headache. Pain   amoxicillin-clavulanate (AUGMENTIN) 875-125 MG tablet Take 1 tablet by mouth 2 (two) times daily.   aspirin EC 81 MG tablet Take 81 mg by  mouth daily.   B Complex-Biotin-FA (B-COMPLEX PO) Take 1 capsule by mouth daily.   cetirizine-pseudoephedrine (ZYRTEC-D) 5-120 MG per tablet Take 1 tablet by mouth daily as needed for allergies.   Cholecalciferol (VITAMIN D3) 5000 units CAPS Take 1 capsule (5,000 Units total) by mouth daily.   clotrimazole-betamethasone (LOTRISONE) cream Apply 1 application topically 2 (two) times daily.   ibuprofen (ADVIL,MOTRIN) 200 MG tablet Take 400 mg by mouth every 6 (six) hours as needed. Pain   methylPREDNISolone (MEDROL) 4 MG TBPK tablet Take by mouth as directed for 6 days   Multiple Vitamin (MULTIVITAMIN) tablet Take 1 tablet by mouth daily.   scopolamine (TRANSDERM-SCOP) 1 MG/3DAYS Place 1 patch (1.5 mg total) onto the skin every 3 (three) days.   tinidazole (TINDAMAX) 500 MG tablet Take 2 tabs po bid x 2 days.   triamcinolone ointment (KENALOG) 0.1 % Apply 1 application topically 2 (two) times daily.   [DISCONTINUED] ALPRAZolam (XANAX) 0.5 MG tablet Take 0.5-1 tablets (0.25-0.5 mg total) by mouth as  needed for anxiety (panic attack). anxiety   [DISCONTINUED] buPROPion (WELLBUTRIN XL) 300 MG 24 hr tablet Take 1 tablet (300 mg total) by mouth daily. **PLEASE CONTACT OUR OFFICE TO SCHEDULE A FOLLOW UP FOR FUTURE MED REFILLS**   [DISCONTINUED] escitalopram (LEXAPRO) 10 MG tablet Take 1 tablet (10 mg total) by mouth daily. **PLEASE CONTACT OUR OFFICE TO SCHEDULE A FOLLOW UP FOR FUTURE MED REFILLS**   [DISCONTINUED] hydrOXYzine (ATARAX/VISTARIL) 10 MG tablet Take 1 t o2 tablets po BID prn itching.   No facility-administered medications prior to visit.    Review of Systems Review of Systems:  A fourteen system review of systems was performed and found to be positive as per HPI.  Last CBC Lab Results  Component Value Date   WBC 4.6 10/06/2021   HGB 12.9 10/06/2021   HCT 37.6 10/06/2021   MCV 89.5 10/06/2021   MCH 30.7 10/06/2021   RDW 12.0 10/06/2021   PLT 206 10/06/2021   Last metabolic  panel Lab Results  Component Value Date   GLUCOSE 101 (H) 10/06/2021   NA 137 10/06/2021   K 4.1 10/06/2021   CL 104 10/06/2021   CO2 25 10/06/2021   BUN 19 10/06/2021   CREATININE 0.73 10/06/2021   GFRNONAA >60 10/06/2021   CALCIUM 9.1 10/06/2021   PROT 7.1 10/06/2021   ALBUMIN 4.0 10/06/2021   LABGLOB 2.7 09/12/2020   AGRATIO 1.6 09/12/2020   BILITOT 0.6 10/06/2021   ALKPHOS 97 10/06/2021   AST 18 10/06/2021   ALT 19 10/06/2021   ANIONGAP 8 10/06/2021   Last lipids Lab Results  Component Value Date   CHOL 174 09/12/2020   HDL 51 09/12/2020   LDLCALC 105 (H) 09/12/2020   TRIG 100 09/12/2020   CHOLHDL 3.4 09/12/2020   Last hemoglobin A1c Lab Results  Component Value Date   HGBA1C 5.1 09/12/2020   Last thyroid functions Lab Results  Component Value Date   TSH 0.853 09/12/2020   Last vitamin D Lab Results  Component Value Date   VD25OH 56.4 09/12/2020   Last vitamin B12 and Folate No results found for: VITAMINB12, FOLATE   Objective    BP 98/64   Pulse (!) 111   Temp 97.7 F (36.5 C)   Ht 5' 6.5" (1.689 m)   Wt 186 lb (84.4 kg)   SpO2 98%   BMI 29.57 kg/m  BP Readings from Last 3 Encounters:  04/05/22 98/64  10/07/21 129/73  06/14/21 123/75   Wt Readings from Last 3 Encounters:  04/05/22 186 lb (84.4 kg)  06/14/21 190 lb 9.6 oz (86.5 kg)  06/09/21 191 lb 6.4 oz (86.8 kg)    Physical Exam  General:  Well Developed, well nourished, appropriate for stated age.  Neuro:  Alert and oriented,  extra-ocular muscles intact  HEENT:  Normocephalic, atraumatic, neck supple  Skin:  no gross rash, warm, pink. Cardiac:  RRR, S1 S2 Respiratory: CTA B/L  Vascular:  Ext warm, no cyanosis apprec.; cap RF less 2 sec. Psych:  No HI/SI, judgement and insight good, Euthymic mood. Full Affect.   No results found for any visits on 04/05/22.  Assessment & Plan      Problem List Items Addressed This Visit       Other   Depression (Chronic)    -PHQ-9 score  of 9, higher than baseline likely secondary to running out of medications. Will provide refills of Lexapro 5 mg and Wellbutrin XL 300 mg daily. Will continue to monitor.  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 Provided medication refills of Lexapro 5 mg and Wellbutrin XL 300 mg. Will continue to monitor.       Relevant Medications   escitalopram (LEXAPRO) 10 MG tablet   buPROPion (WELLBUTRIN XL) 300 MG 24 hr tablet   Panic anxiety syndrome    -Patient prefers to change from alprazolam to hydroxyzine so will discontinue medication. Provided refills of hydroxyzine. Will continue to monitor.       Relevant Medications   hydrOXYzine (ATARAX) 10 MG tablet   escitalopram (LEXAPRO) 10 MG tablet   buPROPion (WELLBUTRIN XL) 300 MG 24 hr tablet    Return in about 4 months (around 08/06/2022) for CPE and FBW.        Lorrene Reid, PA-C  [facility] at [facility] [phone] (phone) [phone] (fax)  [place]

## 2022-04-05 NOTE — Assessment & Plan Note (Signed)
-  Patient prefers to change from alprazolam to hydroxyzine so will discontinue medication. Provided refills of hydroxyzine. Will continue to monitor.

## 2022-04-05 NOTE — Assessment & Plan Note (Signed)
-  Stable. Provided medication refills of Lexapro 5 mg and Wellbutrin XL 300 mg. Will continue to monitor.

## 2022-08-10 ENCOUNTER — Encounter: Payer: Self-pay | Admitting: Physician Assistant

## 2022-08-10 ENCOUNTER — Ambulatory Visit (INDEPENDENT_AMBULATORY_CARE_PROVIDER_SITE_OTHER): Payer: 59 | Admitting: Physician Assistant

## 2022-08-10 VITALS — BP 116/74 | HR 67 | Temp 97.8°F | Ht 67.0 in | Wt 187.0 lb

## 2022-08-10 DIAGNOSIS — E559 Vitamin D deficiency, unspecified: Secondary | ICD-10-CM

## 2022-08-10 DIAGNOSIS — Z0000 Encounter for general adult medical examination without abnormal findings: Secondary | ICD-10-CM | POA: Diagnosis not present

## 2022-08-10 DIAGNOSIS — M79672 Pain in left foot: Secondary | ICD-10-CM | POA: Diagnosis not present

## 2022-08-10 DIAGNOSIS — Z1231 Encounter for screening mammogram for malignant neoplasm of breast: Secondary | ICD-10-CM | POA: Diagnosis not present

## 2022-08-10 NOTE — Progress Notes (Signed)
Complete physical exam   Patient: Stephanie Chan   DOB: [birth year]   56 y.o. Female  MRN: [id number] Visit Date: 08/10/2022   Chief Complaint  Patient presents with   Annual Exam   Subjective    Stephanie Chan is a 56 y.o. female who presents today for a complete physical exam.  She reports consuming a general diet.  Walking daily  She generally feels fairly well. She does have additional problems to discuss today. Intermittent left heel pain/achilles tendon.     Past Medical History:  Diagnosis Date   Allergy    Bundle branch block    Depression    Migraines    Panic attack    Vertigo    Past Surgical History:  Procedure Laterality Date   CESAREAN SECTION     WISDOM TOOTH EXTRACTION     Social History   Socioeconomic History   Marital status: Married    Spouse name: Not on file   Number of children: Not on file   Years of education: Not on file   Highest education level: Not on file  Occupational History   Not on file  Tobacco Use   Smoking status: Never   Smokeless tobacco: Never  Vaping Use   Vaping Use: Never used  Substance and Sexual Activity   Alcohol use: Yes    Alcohol/week: 2.0 standard drinks of alcohol    Types: 2 Glasses of wine per week    Comment: socially   Drug use: No   Sexual activity: Yes    Partners: Male    Birth control/protection: Surgical, Post-menopausal  Other Topics Concern   Not on file  Social History Narrative   Not on file   Social Determinants of Health   Financial Resource Strain: Not on file  Food Insecurity: Not on file  Transportation Needs: Not on file  Physical Activity: Not on file  Stress: Not on file  Social Connections: Not on file  Intimate Partner Violence: Not on file     Medications: Outpatient Medications Prior to Visit  Medication Sig   acetaminophen (TYLENOL) 500 MG tablet Take 1,000 mg by mouth every 6 (six) hours as needed for moderate pain or headache. Pain   aspirin EC 81 MG  tablet Take 81 mg by mouth daily.   B Complex-Biotin-FA (B-COMPLEX PO) Take 1 capsule by mouth daily.   buPROPion (WELLBUTRIN XL) 300 MG 24 hr tablet Take 1 tablet (300 mg total) by mouth daily.   cetirizine-pseudoephedrine (ZYRTEC-D) 5-120 MG per tablet Take 1 tablet by mouth daily as needed for allergies.   Cholecalciferol (VITAMIN D3) 5000 units CAPS Take 1 capsule (5,000 Units total) by mouth daily.   clotrimazole-betamethasone (LOTRISONE) cream Apply 1 application topically 2 (two) times daily.   escitalopram (LEXAPRO) 10 MG tablet Take 0.5 tablets (5 mg total) by mouth daily.   hydrOXYzine (ATARAX) 10 MG tablet Take 1 t o2 tablets po BID prn itching.   ibuprofen (ADVIL,MOTRIN) 200 MG tablet Take 400 mg by mouth every 6 (six) hours as needed. Pain   Multiple Vitamin (MULTIVITAMIN) tablet Take 1 tablet by mouth daily.   scopolamine (TRANSDERM-SCOP) 1 MG/3DAYS Place 1 patch (1.5 mg total) onto the skin every 3 (three) days.   tinidazole (TINDAMAX) 500 MG tablet Take 2 tabs po bid x 2 days.   [DISCONTINUED] methylPREDNISolone (MEDROL) 4 MG TBPK tablet Take by mouth as directed for 6 days   [DISCONTINUED] triamcinolone ointment (KENALOG) 0.1 % Apply 1  application topically 2 (two) times daily.   [DISCONTINUED] amoxicillin-clavulanate (AUGMENTIN) 875-125 MG tablet Take 1 tablet by mouth 2 (two) times daily. (Patient not taking: Reported on 08/10/2022)   No facility-administered medications prior to visit.    Review of Systems Review of Systems:  A fourteen system review of systems was performed and found to be positive as per HPI.  Last CBC Lab Results  Component Value Date   WBC 4.6 10/06/2021   HGB 12.9 10/06/2021   HCT 37.6 10/06/2021   MCV 89.5 10/06/2021   MCH 30.7 10/06/2021   RDW 12.0 10/06/2021   PLT 206 76/16/0737   Last metabolic panel Lab Results  Component Value Date   GLUCOSE 101 (H) 10/06/2021   NA 137 10/06/2021   K 4.1 10/06/2021   CL 104 10/06/2021   CO2 25  10/06/2021   BUN 19 10/06/2021   CREATININE 0.73 10/06/2021   GFRNONAA >60 10/06/2021   CALCIUM 9.1 10/06/2021   PROT 7.1 10/06/2021   ALBUMIN 4.0 10/06/2021   LABGLOB 2.7 09/12/2020   AGRATIO 1.6 09/12/2020   BILITOT 0.6 10/06/2021   ALKPHOS 97 10/06/2021   AST 18 10/06/2021   ALT 19 10/06/2021   ANIONGAP 8 10/06/2021   Last lipids Lab Results  Component Value Date   CHOL 174 09/12/2020   HDL 51 09/12/2020   LDLCALC 105 (H) 09/12/2020   TRIG 100 09/12/2020   CHOLHDL 3.4 09/12/2020   Last hemoglobin A1c Lab Results  Component Value Date   HGBA1C 5.1 09/12/2020   Last thyroid functions Lab Results  Component Value Date   TSH 0.853 09/12/2020   Last vitamin D Lab Results  Component Value Date   VD25OH 56.4 09/12/2020      Objective     BP 116/74   Pulse 67   Temp 97.8 F (36.6 C) (Temporal)   Ht _0  (1.702 m)   Wt 187 lb (84.8 kg)   BMI 29.29 kg/m  BP Readings from Last 3 Encounters:  08/10/22 116/74  04/05/22 98/64  10/07/21 129/73   Wt Readings from Last 3 Encounters:  08/10/22 187 lb (84.8 kg)  04/05/22 186 lb (84.4 kg)  06/14/21 190 lb 9.6 oz (86.5 kg)    Physical Exam   General Appearance:       Alert, cooperative, in no acute distress, appears stated age   Head:    Normocephalic, without obvious abnormality, atraumatic  Eyes:    PERRL, conjunctiva/corneas clear, EOM's intact, fundi    benign, both eyes  Ears:    Normal TM's and external ear canals, both ears  Nose:   Nares normal, septum midline, mucosa normal, no drainage    or sinus tenderness  Throat:   Lips, mucosa, and tongue normal; teeth and gums normal  Neck:   Supple, symmetrical, trachea midline, no adenopathy;    thyroid:  no enlargement/tenderness/nodules; no  JVD  Back:     Symmetric, no curvature, ROM normal, no CVA tenderness  Lungs:     Clear to auscultation bilaterally, respirations unlabored  Chest Wall:    No tenderness or deformity   Heart:    Normal heart rate.  Normal rhythm. No murmurs, rubs, or gallops.   Breast Exam:    deferred  Abdomen:     Soft, non-tender, bowel sounds active all four quadrants,    no masses, no organomegaly  Pelvic:    deferred  Extremities:   All extremities are intact. No cyanosis or edema  Pulses:  2+ and symmetric all extremities  Skin:   Skin color, texture, turgor normal, no rashes or lesions  Lymph nodes:   Cervical, supraclavicular nodes normal  Neurologic:   CNII-XII grossly intact.     Last depression screening scores    08/10/2022    9:35 AM 04/05/2022    2:46 PM 05/03/2021    2:50 PM  PHQ 2/9 Scores  PHQ - 2 Score 2 2 0  PHQ- 9 Score 8 9 0   Last fall risk screening    08/10/2022    9:34 AM  [place] in the past year? 0  Number falls in past yr: 0  Injury with Fall? 0  Risk for fall due to : No Fall Risks  Follow up Falls evaluation completed     No results found for any visits on 08/10/22.  Assessment & Plan    Routine Health Maintenance and Physical Exam  Exercise Activities and Dietary recommendations -Recommend heart healthy diet low in fat and carbohydrates. Recommend moderate exercise 150 mins/wk.  Immunization History  Administered Date(s) Administered   Influenza,inj,Quad PF,6+ Mos 10/22/2017, 10/06/2018   Tdap 12/26/2012    Health Maintenance  Topic Date Due   COVID-19 Vaccine (1) Never done   HIV Screening  Never done   Hepatitis C Screening  Never done   MAMMOGRAM  Never done   Zoster Vaccines- Shingrix (1 of 2) Never done   INFLUENZA VACCINE  06/19/2022   TETANUS/TDAP  12/26/2022   PAP SMEAR-Modifier  01/12/2024   COLONOSCOPY (Pts 45-34yr Insurance coverage will need to be confirmed)  12/19/2027   HPV VACCINES  Aged Out    Discussed health benefits of physical activity, and encouraged her to engage in regular exercise appropriate for her age and condition.  Problem List Items Addressed This Visit       Other   Vitamin D insufficiency (Chronic)    Relevant Orders   Vitamin D (25 hydroxy)   Other Visit Diagnoses     Healthcare maintenance    -  Primary   Relevant Orders   CBC w/Diff   Comp Met (CMET)   Lipid Profile   HgB A1c   TSH   Vitamin D (25 hydroxy)   Encounter for screening mammogram for malignant neoplasm of breast       Relevant Orders   MM Digital Screening   Pain of left heel          Recommend to schedule lab visit for routine fasting labs. Pt agreeable to mammogram, will place order. UTD pap (followed by OB/GYN), colonoscopy and Tdap. Discussed conservative therapy for left heel pain which is suggestive of achilles tendonitis. If symptom fail to improve or worsen recommend referral to podiatry. Continue current medication regimen. Follow-up in 6 month for reg OV (mood/med management).   Return in about 6 months (around 02/08/2023) for Mood; lab visit in 1-4 weeks for FBW.       MLorrene Reid PA-C  [phone](phone) [phone](fax)  [place]

## 2022-08-10 NOTE — Patient Instructions (Signed)
Preventive Care 40-55 Years Old, Female Preventive care refers to lifestyle choices and visits with your health care provider that can promote health and wellness. Preventive care visits are also called wellness exams. What can I expect for my preventive care visit? Counseling Your health care provider may ask you questions about your: Medical history, including: Past medical problems. Family medical history. Pregnancy history. Current health, including: Menstrual cycle. Method of birth control. Emotional well-being. Home life and relationship well-being. Sexual activity and sexual health. Lifestyle, including: Alcohol, nicotine or tobacco, and drug use. Access to firearms. Diet, exercise, and sleep habits. Work and work environment. Sunscreen use. Safety issues such as seatbelt and bike helmet use. Physical exam Your health care provider will check your: Height and weight. These may be used to calculate your BMI (body mass index). BMI is a measurement that tells if you are at a healthy weight. Waist circumference. This measures the distance around your waistline. This measurement also tells if you are at a healthy weight and may help predict your risk of certain diseases, such as type 2 diabetes and high blood pressure. Heart rate and blood pressure. Body temperature. Skin for abnormal spots. What immunizations do I need?  Vaccines are usually given at various ages, according to a schedule. Your health care provider will recommend vaccines for you based on your age, medical history, and lifestyle or other factors, such as travel or where you work. What tests do I need? Screening Your health care provider may recommend screening tests for certain conditions. This may include: Lipid and cholesterol levels. Diabetes screening. This is done by checking your blood sugar (glucose) after you have not eaten for a while (fasting). Pelvic exam and Pap test. Hepatitis B test. Hepatitis C  test. HIV (human immunodeficiency virus) test. STI (sexually transmitted infection) testing, if you are at risk. Lung cancer screening. Colorectal cancer screening. Mammogram. Talk with your health care provider about when you should start having regular mammograms. This may depend on whether you have a family history of breast cancer. BRCA-related cancer screening. This may be done if you have a family history of breast, ovarian, tubal, or peritoneal cancers. Bone density scan. This is done to screen for osteoporosis. Talk with your health care provider about your test results, treatment options, and if necessary, the need for more tests. Follow these instructions at home: Eating and drinking  Eat a diet that includes fresh fruits and vegetables, whole grains, lean protein, and low-fat dairy products. Take vitamin and mineral supplements as recommended by your health care provider. Do not drink alcohol if: Your health care provider tells you not to drink. You are pregnant, may be pregnant, or are planning to become pregnant. If you drink alcohol: Limit how much you have to 0-1 drink a day. Know how much alcohol is in your drink. In the [place] one drink equals one 12 oz bottle of beer (355 mL), one 5 oz glass of wine (148 mL), or one 1 oz glass of hard liquor (44 mL). Lifestyle Brush your teeth every morning and night with fluoride toothpaste. Floss one time each day. Exercise for at least 30 minutes 5 or more days each week. Do not use any products that contain nicotine or tobacco. These products include cigarettes, chewing tobacco, and vaping devices, such as e-cigarettes. If you need help quitting, ask your health care provider. Do not use drugs. If you are sexually active, practice safe sex. Use a condom or other form of protection to   prevent STIs. If you do not wish to become pregnant, use a form of birth control. If you plan to become pregnant, see your health care provider for a  prepregnancy visit. Take aspirin only as told by your health care provider. Make sure that you understand how much to take and what form to take. Work with your health care provider to find out whether it is safe and beneficial for you to take aspirin daily. Find healthy ways to manage stress, such as: Meditation, yoga, or listening to music. Journaling. Talking to a trusted person. Spending time with friends and family. Minimize exposure to UV radiation to reduce your risk of skin cancer. Safety Always wear your seat belt while driving or riding in a vehicle. Do not drive: If you have been drinking alcohol. Do not ride with someone who has been drinking. When you are tired or distracted. While texting. If you have been using any mind-altering substances or drugs. Wear a helmet and other protective equipment during sports activities. If you have firearms in your house, make sure you follow all gun safety procedures. Seek help if you have been physically or sexually abused. What's next? Visit your health care provider once a year for an annual wellness visit. Ask your health care provider how often you should have your eyes and teeth checked. Stay up to date on all vaccines. This information is not intended to replace advice given to you by your health care provider. Make sure you discuss any questions you have with your health care provider. Document Revised: 05/03/2021 Document Reviewed: 05/03/2021 Elsevier Patient Education  [place].

## 2022-08-31 ENCOUNTER — Other Ambulatory Visit: Payer: 59

## 2022-09-03 ENCOUNTER — Other Ambulatory Visit: Payer: 59

## 2022-09-03 DIAGNOSIS — Z0000 Encounter for general adult medical examination without abnormal findings: Secondary | ICD-10-CM

## 2022-09-03 DIAGNOSIS — E559 Vitamin D deficiency, unspecified: Secondary | ICD-10-CM

## 2022-09-03 DIAGNOSIS — Z1231 Encounter for screening mammogram for malignant neoplasm of breast: Secondary | ICD-10-CM

## 2022-09-04 LAB — CBC WITH DIFFERENTIAL/PLATELET
Basophils Absolute: 0 10*3/uL (ref 0.0–0.2)
Basos: 1 %
EOS (ABSOLUTE): 0.1 10*3/uL (ref 0.0–0.4)
Eos: 2 %
Hematocrit: 36.7 % (ref 34.0–46.6)
Hemoglobin: 12.6 g/dL (ref 11.1–15.9)
Immature Grans (Abs): 0 10*3/uL (ref 0.0–0.1)
Immature Granulocytes: 0 %
Lymphocytes Absolute: 1.4 10*3/uL (ref 0.7–3.1)
Lymphs: 35 %
MCH: 29.9 pg (ref 26.6–33.0)
MCHC: 34.3 g/dL (ref 31.5–35.7)
MCV: 87 fL (ref 79–97)
Monocytes Absolute: 0.3 10*3/uL (ref 0.1–0.9)
Monocytes: 7 %
Neutrophils Absolute: 2.2 10*3/uL (ref 1.4–7.0)
Neutrophils: 55 %
Platelets: 196 10*3/uL (ref 150–450)
RBC: 4.22 x10E6/uL (ref 3.77–5.28)
RDW: 12.7 % (ref 11.7–15.4)
WBC: 3.9 10*3/uL (ref 3.4–10.8)

## 2022-09-04 LAB — COMPREHENSIVE METABOLIC PANEL
ALT: 18 IU/L (ref 0–32)
AST: 14 IU/L (ref 0–40)
Albumin/Globulin Ratio: 1.9 (ref 1.2–2.2)
Albumin: 4.5 g/dL (ref 3.8–4.9)
Alkaline Phosphatase: 96 IU/L (ref 44–121)
BUN/Creatinine Ratio: 23 (ref 9–23)
BUN: 17 mg/dL (ref 6–24)
Bilirubin Total: 0.4 mg/dL (ref 0.0–1.2)
CO2: 22 mmol/L (ref 20–29)
Calcium: 9 mg/dL (ref 8.7–10.2)
Chloride: 103 mmol/L (ref 96–106)
Creatinine, Ser: 0.75 mg/dL (ref 0.57–1.00)
Globulin, Total: 2.4 g/dL (ref 1.5–4.5)
Glucose: 88 mg/dL (ref 70–99)
Potassium: 4.3 mmol/L (ref 3.5–5.2)
Sodium: 141 mmol/L (ref 134–144)
Total Protein: 6.9 g/dL (ref 6.0–8.5)
eGFR: 95 mL/min/{1.73_m2} (ref >59)

## 2022-09-04 LAB — HEMOGLOBIN A1C
Est. average glucose Bld gHb Est-mCnc: 103 mg/dL
Hgb A1c MFr Bld: 5.2 % (ref 4.8–5.6)

## 2022-09-04 LAB — LIPID PANEL
Chol/HDL Ratio: 3.5 ratio (ref 0.0–4.4)
Cholesterol, Total: 184 mg/dL (ref 100–199)
HDL: 52 mg/dL (ref >39)
LDL Chol Calc (NIH): 113 mg/dL — ABNORMAL HIGH (ref 0–99)
Triglycerides: 105 mg/dL (ref 0–149)
VLDL Cholesterol Cal: 19 mg/dL (ref 5–40)

## 2022-09-04 LAB — VITAMIN D 25 HYDROXY (VIT D DEFICIENCY, FRACTURES): Vit D, 25-Hydroxy: 60 ng/mL (ref 30.0–100.0)

## 2022-09-04 LAB — TSH: TSH: 0.693 u[IU]/mL (ref 0.450–4.500)

## 2022-09-05 ENCOUNTER — Encounter: Payer: Self-pay | Admitting: Physician Assistant

## 2022-10-09 ENCOUNTER — Ambulatory Visit: Payer: 59

## 2022-11-08 ENCOUNTER — Other Ambulatory Visit: Payer: Self-pay | Admitting: Nurse Practitioner

## 2022-11-08 DIAGNOSIS — F4323 Adjustment disorder with mixed anxiety and depressed mood: Secondary | ICD-10-CM

## 2022-11-08 DIAGNOSIS — F32A Depression, unspecified: Secondary | ICD-10-CM

## 2022-11-19 HISTORY — PX: ACHILLES TENDON SURGERY: SHX542

## 2022-12-19 ENCOUNTER — Telehealth: Payer: Self-pay | Admitting: *Deleted

## 2022-12-19 DIAGNOSIS — Z87898 Personal history of other specified conditions: Secondary | ICD-10-CM

## 2022-12-19 DIAGNOSIS — R112 Nausea with vomiting, unspecified: Secondary | ICD-10-CM

## 2022-12-19 MED ORDER — SCOPOLAMINE 1 MG/3DAYS TD PT72: 1.0000 | MEDICATED_PATCH | TRANSDERMAL | 0 refills | Status: DC

## 2022-12-19 NOTE — Telephone Encounter (Signed)
Patches sent

## 2022-12-19 NOTE — Telephone Encounter (Signed)
Patient calling with a request to fill her Scopolamine patch.  She is in [facility] and forgot to get them to take with her and she has something that may cause her to have some nausea.  She will be back on Saturday from her trip.  She wouldn't need very many she said.  If you are able to send some in she would like them to to [facility] [place], [place]. Please contact pt and let her know if we are able to send some in. Best number to reach her is [phone]. Valeria Krisko Zimmerman Rumple, CMA

## 2023-02-26 ENCOUNTER — Ambulatory Visit (INDEPENDENT_AMBULATORY_CARE_PROVIDER_SITE_OTHER): Payer: 59 | Admitting: Family Medicine

## 2023-02-26 ENCOUNTER — Encounter: Payer: Self-pay | Admitting: Family Medicine

## 2023-02-26 VITALS — BP 109/75 | HR 62 | Resp 18 | Ht 67.0 in | Wt 188.0 lb

## 2023-02-26 DIAGNOSIS — Z23 Encounter for immunization: Secondary | ICD-10-CM

## 2023-02-26 DIAGNOSIS — F32A Depression, unspecified: Secondary | ICD-10-CM | POA: Diagnosis not present

## 2023-02-26 DIAGNOSIS — N951 Menopausal and female climacteric states: Secondary | ICD-10-CM

## 2023-02-26 DIAGNOSIS — F410 Panic disorder [episodic paroxysmal anxiety] without agoraphobia: Secondary | ICD-10-CM | POA: Diagnosis not present

## 2023-02-26 DIAGNOSIS — N912 Amenorrhea, unspecified: Secondary | ICD-10-CM | POA: Diagnosis not present

## 2023-02-26 DIAGNOSIS — Z87898 Personal history of other specified conditions: Secondary | ICD-10-CM

## 2023-02-26 DIAGNOSIS — R112 Nausea with vomiting, unspecified: Secondary | ICD-10-CM

## 2023-02-26 DIAGNOSIS — M79672 Pain in left foot: Secondary | ICD-10-CM

## 2023-02-26 MED ORDER — BUPROPION HCL ER (XL) 300 MG PO TB24
300.0000 mg | ORAL_TABLET | Freq: Every day | ORAL | 1 refills | Status: DC
Start: 2023-02-26 — End: 2023-10-09

## 2023-02-26 MED ORDER — ESCITALOPRAM OXALATE 10 MG PO TABS
5.0000 mg | ORAL_TABLET | Freq: Every day | ORAL | 1 refills | Status: DC
Start: 2023-02-26 — End: 2023-10-09

## 2023-02-26 MED ORDER — HYDROXYZINE HCL 10 MG PO TABS
ORAL_TABLET | ORAL | 1 refills | Status: DC
Start: 2023-02-26 — End: 2023-04-22

## 2023-02-26 MED ORDER — SCOPOLAMINE 1 MG/3DAYS TD PT72
1.0000 | MEDICATED_PATCH | TRANSDERMAL | 0 refills | Status: DC
Start: 2023-02-26 — End: 2024-04-14

## 2023-02-26 NOTE — Assessment & Plan Note (Addendum)
PHQ-9 score of 10, GAD-7 score of 4.  PHQ-9 score is much higher than baseline, GAD-7 is down.  We discussed increasing Lexapro as this would be helpful for low mood.  Patient stated that she would prefer to stay on half a tablet daily, but I did let her know that it would be safe and recommended to increase to a full tablet of 10 mg each day.  Continue bupropion 300 mg daily, Lexapro 5 mg daily, hydroxyzine 10 mg as needed.  Will continue to monitor.  Regarding sexual side effects of decreased libido and anorgasmia, we discussed that if this is not something that goes away after few months of being on the medication, the best option would be to switch the medication for Augmentin.  She is already taking Wellbutrin with no improvement.  If sexual side effects do become bothersome enough that she wants to make a change, we can switch to a different SSRI, switch to mirtazapine, or add a PDE 5 inhibitor like sildenafil.

## 2023-02-26 NOTE — Progress Notes (Signed)
Established Patient Office Visit  Subjective   Patient ID: Stephanie Chan, female    DOB: 1966-03-31  Age: 57 y.o. MRN: [id number]  Chief Complaint  Patient presents with   Anxiety   Depression    HPI Stephanie Chan is a 57 y.o. female presenting today for follow up of mood.  She would also like confirmation that she is menopausal by testing her hormone levels today.  She has not had a period for at least 10 years but has never had her hormones checked.  She is also still experiencing pain in her left heel and the left side of her neck.  She feels the heel pain warrants a referral to podiatry at this time but would like to continue watchful waiting for the neck pain. Mood: Patient is here to follow up for depression, currently managing with bupropion, Lexapro, hydroxyzine. Taking medication without side effects, reports excellent compliance with treatment. Denies mood changes or SI/HI. She feels mood is stable since last visit.  She did have a period of time where she took herself off of Lexapro but experienced increased anxiety, so she restarted it.  She has been experiencing a decrease in libido and anorgasmia, but she is hesitant to change Lexapro since she has been taking it for so long and does find it helpful for anxiety.     02/26/2023    8:51 AM 08/10/2022    9:35 AM 04/05/2022    2:46 PM  Depression screen PHQ 2/9  Decreased Interest 1 1 1   Down, Depressed, Hopeless 1 1 1   PHQ - 2 Score 2 2 2   Altered sleeping 2 1 1   Tired, decreased energy 2 1 0  Change in appetite 1 1 0  Feeling bad or failure about yourself  1 1 1   Trouble concentrating 1 1 2   Moving slowly or fidgety/restless 0 1 3  Suicidal thoughts 1 0 0  PHQ-9 Score 10 8 9   Difficult doing work/chores Somewhat difficult Somewhat difficult Somewhat difficult       02/26/2023    8:52 AM 08/10/2022    9:35 AM 04/05/2022    2:47 PM 05/03/2021    2:51 PM  GAD 7 : Generalized Anxiety Score  Nervous, Anxious, on  Edge 1 1 1 3   Control/stop worrying 0 1 1 3   Worry too much - different things 1 1 1 2   Trouble relaxing 1 1 1 1   Restless 0 1 0 1  Easily annoyed or irritable 1 1 2 1   Afraid - awful might happen 0 0 0 0  Total GAD 7 Score 4 6 6 11   Anxiety Difficulty Somewhat difficult Somewhat difficult Somewhat difficult Not difficult at all   ROS Negative unless otherwise noted in HPI   Objective:     BP 109/75 (BP Location: Left Arm, Patient Position: Sitting, Cuff Size: Normal)   Pulse 62   Resp 18   Ht 5\' 7"  (1.702 m)   Wt 188 lb (85.3 kg)   SpO2 97%   BMI 29.44 kg/m   Physical Exam Constitutional:      General: She is not in acute distress.    Appearance: Normal appearance.  HENT:     Head: Normocephalic and atraumatic.  Cardiovascular:     Rate and Rhythm: Normal rate and regular rhythm.     Heart sounds: Normal heart sounds. No murmur heard.    No friction rub. No gallop.  Pulmonary:     Effort: Pulmonary effort  is normal. No respiratory distress.     Breath sounds: No wheezing, rhonchi or rales.  Musculoskeletal:     Cervical back: Normal range of motion.  Skin:    General: Skin is warm and dry.  Neurological:     General: No focal deficit present.     Mental Status: She is alert and oriented to person, place, and time. Mental status is at baseline.  Psychiatric:        Mood and Affect: Mood normal.        Thought Content: Thought content normal.        Judgment: Judgment normal.     Assessment & Plan:  Depression, unspecified depression type Assessment & Plan: PHQ-9 score of 10, GAD-7 score of 4.  PHQ-9 score is much higher than baseline, GAD-7 is down.  We discussed increasing Lexapro as this would be helpful for low mood.  Patient stated that she would prefer to stay on half a tablet daily, but I did let her know that it would be safe and recommended to increase to a full tablet of 10 mg each day.  Continue bupropion 300 mg daily, Lexapro 5 mg daily, hydroxyzine 10  mg as needed.  Will continue to monitor.  Regarding sexual side effects of decreased libido and anorgasmia, we discussed that if this is not something that goes away after few months of being on the medication, the best option would be to switch the medication for Augmentin.  She is already taking Wellbutrin with no improvement.  If sexual side effects do become bothersome enough that she wants to make a change, we can switch to a different SSRI, switch to mirtazapine, or add a PDE 5 inhibitor like sildenafil.  Orders: -     buPROPion HCl ER (XL); Take 1 tablet (300 mg total) by mouth daily.  Dispense: 90 tablet; Refill: 1 -     Escitalopram Oxalate; Take 0.5 tablets (5 mg total) by mouth daily.  Dispense: 45 tablet; Refill: 1  Panic anxiety syndrome Assessment & Plan: PHQ-9 score of 10, GAD-7 score of 4.  PHQ-9 score is much higher than baseline, GAD-7 is down.  We discussed increasing Lexapro as this would be helpful for low mood.  Patient stated that she would prefer to stay on half a tablet daily, but I did let her know that it would be safe and recommended to increase to a full tablet of 10 mg each day.  Continue bupropion 300 mg daily, Lexapro 5 mg daily, hydroxyzine 10 mg as needed.  Will continue to monitor.  Orders: -     hydrOXYzine HCl; Take 1 t o2 tablets po BID prn itching.  Dispense: 60 tablet; Refill: 1  Menopausal symptoms -     Follicle stimulating hormone; Future -     Estradiol  Amenorrhea -     Follicle stimulating hormone; Future -     Estradiol  Pain of left heel -     Ambulatory referral to Podiatry  Need for Tdap vaccination -     Tdap vaccine greater than or equal to 7yo IM  H/O motion sickness -     Scopolamine; Place 1 patch (1.5 mg total) onto the skin every 3 (three) days.  Dispense: 10 patch; Refill: 0  Nausea and vomiting, unspecified vomiting type -     Scopolamine; Place 1 patch (1.5 mg total) onto the skin every 3 (three) days.  Dispense: 10 patch;  Refill: 0  Testing FSH and estradiol  today. Referral to podiatry made regarding left heel pain.  Return in about 6 months (around 08/28/2023) for annual physical, fasting blood work 1 week before.    Melida Quitter, PA

## 2023-02-26 NOTE — Patient Instructions (Addendum)
It is very safe for you to increase your Lexapro to a full tablet each day.  I would actually recommend starting to take a full tablet of Lexapro daily based on some of the low mood that you have been experiencing.  With the side effects that you have been having with Lexapro, I do think it is also very reasonable to think about switching to a different medication.  This could be a different medication within the same class of SSRIs such as Prozac or Zoloft, or we could try a different medication entirely such as mirtazapine.  I have included some print out information about mirtazapine if you wanted to read about it.  The other option would be to have you start taking a medication called sildenafil, the brand name that you may have heard of it is Viagra.  This can be helpful for females experiencing sexual side effects because of medications like Lexapro and improve libido as well.  You can think about these options and let me know how things are going when I see you for your annual, or message me on MyChart if you want to change anything earlier than that!

## 2023-02-26 NOTE — Assessment & Plan Note (Signed)
PHQ-9 score of 10, GAD-7 score of 4.  PHQ-9 score is much higher than baseline, GAD-7 is down.  We discussed increasing Lexapro as this would be helpful for low mood.  Patient stated that she would prefer to stay on half a tablet daily, but I did let her know that it would be safe and recommended to increase to a full tablet of 10 mg each day.  Continue bupropion 300 mg daily, Lexapro 5 mg daily, hydroxyzine 10 mg as needed.  Will continue to monitor.

## 2023-02-27 LAB — ESTRADIOL: Estradiol: <5 pg/mL

## 2023-02-27 LAB — FOLLICLE STIMULATING HORMONE: FSH: 86.4 m[IU]/mL

## 2023-03-11 ENCOUNTER — Ambulatory Visit (INDEPENDENT_AMBULATORY_CARE_PROVIDER_SITE_OTHER): Payer: Self-pay | Admitting: Podiatry

## 2023-03-11 ENCOUNTER — Ambulatory Visit: Payer: 59

## 2023-03-11 DIAGNOSIS — R52 Pain, unspecified: Secondary | ICD-10-CM

## 2023-03-11 DIAGNOSIS — M9262 Juvenile osteochondrosis of tarsus, left ankle: Secondary | ICD-10-CM

## 2023-03-11 DIAGNOSIS — M7662 Achilles tendinitis, left leg: Secondary | ICD-10-CM

## 2023-03-11 DIAGNOSIS — M7732 Calcaneal spur, left foot: Secondary | ICD-10-CM

## 2023-03-11 MED ORDER — METHYLPREDNISOLONE 4 MG PO TBPK
ORAL_TABLET | ORAL | 0 refills | Status: DC
Start: 2023-03-11 — End: 2023-04-22

## 2023-03-11 MED ORDER — MELOXICAM 15 MG PO TABS: 15.0000 mg | ORAL_TABLET | Freq: Every day | ORAL | 0 refills | Status: DC

## 2023-03-11 NOTE — Progress Notes (Signed)
Subjective:  Patient ID: Stephanie Chan, female    DOB: 04/10/1966,  MRN: [id number]  Chief Complaint  Patient presents with   Foot Pain    Pain located in the back of the heel, pain has been ongoing for months, no prior treatments     57 y.o. female presents with pain located at the posterior aspect of the left heel.  She says the pains been ongoing for months.  She does a lot of walking on trails.  She has pain after she is walking with pressure on the back of the heel.  Past Medical History:  Diagnosis Date   Allergy    Bundle branch block    Depression    Migraines    Panic attack    Vertigo     No Known Allergies  ROS: Negative except as per HPI above  Objective:  General: AAO x3, NAD  Dermatological: With inspection and palpation of the right and left lower extremities there are no open sores, no preulcerative lesions, no rash or signs of infection present. Nails are of normal length thickness and coloration.   Vascular:  Dorsalis Pedis artery and Posterior Tibial artery pedal pulses are 2/4 bilateral.  Capillary fill time < 3 sec to all digits.   Neruologic: Grossly intact via light touch bilateral. Protective threshold intact to all sites bilateral.   Musculoskeletal: Pain with palpation of the posterior aspect of the left heel at the mid substance of the posterior calcaneus where the Achilles tendon inserts.  There is noted to be osseous prominence located at this location corresponding with spur seen on lateral view of the x-ray.  Pain is increased with ankle dorsiflexion range of motion.  There is limited range of motion in the ankle especially with ankle dorsiflexion  Gait: Unassisted, Nonantalgic.   No images are attached to the encounter.  Radiographs:  Date: 03/11/2023 XR the left foot Weightbearing AP/Lateral/Oblique   Findings: Attention directed to the posterior aspect of the left heel there is noted to be a hypertrophic osseous spur at the insertion  point of the Achilles tendon with intratendinous tendinous calcification. Assessment:   1. Achilles tendinitis of left lower extremity   2. Bone spur of posterior portion of left calcaneus   3. Haglund's deformity of left heel   4. Pain      Plan:  Patient was evaluated and treated and all questions answered.  # Retrocalcaneal exostosis and Achilles tendinitis of the left lower extremity mild Haglund's deformity -Discussed with patient she has the above issues that are causing inflammation in the posterior aspect of the left heel -I discussed treatment options including conservative and surgical -Reviewed the x-rays in detail with the patient.  She does have evidence of both bone spur at the posterior calcaneus as well as clinically gastroc equinus and Achilles tendinitis -I recommend proceeding with anti-inflammatory modalities including icing stretching and medications -eRx for meloxicam 15 mg take once daily for the next 3 days -eRx for methylprednisolone 4 mg steroid taper pack take as directed for 6 days.  Discussed the risk and benefits of these medications -Provided patient with stretching exercises for the left Achilles -She will follow-up in 4 weeks if no improvement we will consider surgical intervention   Return in about 4 weeks (around 04/08/2023) for Follow-up left Achilles tendinitis and retrocalcaneal exostosis .          Corinna Gab, DPM [facility] / [facility]

## 2023-03-11 NOTE — Patient Instructions (Signed)
Achilles Tendinitis  with Rehab Achilles tendinitis is a disorder of the Achilles tendon. The Achilles tendon connects the large calf muscles (Gastrocnemius and Soleus) to the heel bone (calcaneus). This tendon is sometimes called the heel cord. It is important for pushing-off and standing on your toes and is important for walking, running, or jumping. Tendinitis is often caused by overuse and repetitive microtrauma. SYMPTOMS  Pain, tenderness, swelling, warmth, and redness may occur over the Achilles tendon even at rest.  Pain with pushing off, or flexing or extending the ankle.  Pain that is worsened after or during activity. CAUSES   Overuse sometimes seen with rapid increase in exercise programs or in sports requiring running and jumping.  Poor physical conditioning (strength and flexibility or endurance).  Running sports, especially training running down hills.  Inadequate warm-up before practice or play or failure to stretch before participation.  Injury to the tendon. PREVENTION   Warm up and stretch before practice or competition.  Allow time for adequate rest and recovery between practices and competition.  Keep up conditioning.  Keep up ankle and leg flexibility.  Improve or keep muscle strength and endurance.  Improve cardiovascular fitness.  Use proper technique.  Use proper equipment (shoes, skates).  To help prevent recurrence, taping, protective strapping, or an adhesive bandage may be recommended for several weeks after healing is complete. PROGNOSIS   Recovery may take weeks to several months to heal.  Longer recovery is expected if symptoms have been prolonged.  Recovery is usually quicker if the inflammation is due to a direct blow as compared with overuse or sudden strain. RELATED COMPLICATIONS   Healing time will be prolonged if the condition is not correctly treated. The injury must be given plenty of time to heal.  Symptoms can reoccur if  activity is resumed too soon.  Untreated, tendinitis may increase the risk of tendon rupture requiring additional time for recovery and possibly surgery. TREATMENT   The first treatment consists of rest anti-inflammatory medication, and ice to relieve the pain.  Stretching and strengthening exercises after resolution of pain will likely help reduce the risk of recurrence. Referral to a physical therapist or athletic trainer for further evaluation and treatment may be helpful.  A walking boot or cast may be recommended to rest the Achilles tendon. This can help break the cycle of inflammation and microtrauma.  Arch supports (orthotics) may be prescribed or recommended by your caregiver as an adjunct to therapy and rest.  Surgery to remove the inflamed tendon lining or degenerated tendon tissue is rarely necessary and has shown less than predictable results. MEDICATION   Nonsteroidal anti-inflammatory medications, such as aspirin and ibuprofen, may be used for pain and inflammation relief. Do not take within 7 days before surgery. Take these as directed by your caregiver. Contact your caregiver immediately if any bleeding, stomach upset, or signs of allergic reaction occur. Other minor pain relievers, such as acetaminophen, may also be used.  Pain relievers may be prescribed as necessary by your caregiver. Do not take prescription pain medication for longer than 4 to 7 days. Use only as directed and only as much as you need. HEAT AND COLD  Cold is used to relieve pain and reduce inflammation for acute and chronic Achilles tendinitis. Cold should be applied for 10 to 15 minutes every 2 to 3 hours for inflammation and pain and immediately after any activity that aggravates your symptoms. Use ice packs or an ice massage.  Heat may be used   before performing stretching and strengthening activities prescribed by your caregiver. Use a heat pack or a warm soak. SEEK MEDICAL CARE IF:  Symptoms get  worse or do not improve in 2 weeks despite treatment.  New, unexplained symptoms develop. Drugs used in treatment may produce side effects.   EXERCISES-- hold each stretch for 30 seconds and repeat 10 times.  Complete each stretch 3 times per day.   RANGE OF MOTION (ROM) AND STRETCHING EXERCISES - Achilles Tendinitis  These exercises may help you when beginning to rehabilitate your injury. Your symptoms may resolve with or without further involvement from your physician, physical therapist or athletic trainer. While completing these exercises, remember:   Restoring tissue flexibility helps normal motion to return to the joints. This allows healthier, less painful movement and activity.  An effective stretch should be held for at least 30 seconds.  A stretch should never be painful. You should only feel a gentle lengthening or release in the stretched tissue. STRETCH  Gastroc, Standing   Place hands on wall.  Extend right / left leg, keeping the front knee somewhat bent.  Slightly point your toes inward on your back foot.  Keeping your right / left heel on the floor and your knee straight, shift your weight toward the wall, not allowing your back to arch.  You should feel a gentle stretch in the right / left calf. Hold this position for __________ seconds. Repeat __________ times. Complete this stretch __________ times per day. STRETCH  Soleus, Standing   Place hands on wall.  Extend right / left leg, keeping the other knee somewhat bent.  Slightly point your toes inward on your back foot.  Keep your right / left heel on the floor, bend your back knee, and slightly shift your weight over the back leg so that you feel a gentle stretch deep in your back calf.  Hold this position for __________ seconds. Repeat __________ times. Complete this stretch __________ times per day. STRETCH  Gastrocsoleus, Standing  Note: This exercise can place a lot of stress on your foot and ankle.  Please complete this exercise only if specifically instructed by your caregiver.   Place the ball of your right / left foot on a step, keeping your other foot firmly on the same step.  Hold on to the wall or a rail for balance.  Slowly lift your other foot, allowing your body weight to press your heel down over the edge of the step.  You should feel a stretch in your right / left calf.  Hold this position for __________ seconds.  Repeat this exercise with a slight bend in your knee. Repeat __________ times. Complete this stretch __________ times per day.  STRENGTHENING EXERCISES - Achilles Tendinitis These exercises may help you when beginning to rehabilitate your injury. They may resolve your symptoms with or without further involvement from your physician, physical therapist or athletic trainer. While completing these exercises, remember:   Muscles can gain both the endurance and the strength needed for everyday activities through controlled exercises.  Complete these exercises as instructed by your physician, physical therapist or athletic trainer. Progress the resistance and repetitions only as guided.  You may experience muscle soreness or fatigue, but the pain or discomfort you are trying to eliminate should never worsen during these exercises. If this pain does worsen, stop and make certain you are following the directions exactly. If the pain is still present after adjustments, discontinue the exercise until you can discuss   the trouble with your clinician. STRENGTH - Plantar-flexors   Sit with your right / left leg extended. Holding onto both ends of a rubber exercise band/tubing, loop it around the ball of your foot. Keep a slight tension in the band.  Slowly push your toes away from you, pointing them downward.  Hold this position for __________ seconds. Return slowly, controlling the tension in the band/tubing. Repeat __________ times. Complete this exercise __________ times  per day.  STRENGTH - Plantar-flexors   Stand with your feet shoulder width apart. Steady yourself with a wall or table using as little support as needed.  Keeping your weight evenly spread over the width of your feet, rise up on your toes.*  Hold this position for __________ seconds. Repeat __________ times. Complete this exercise __________ times per day.  *If this is too easy, shift your weight toward your right / left leg until you feel challenged. Ultimately, you may be asked to do this exercise with your right / left foot only. STRENGTH  Plantar-flexors, Eccentric  Note: This exercise can place a lot of stress on your foot and ankle. Please complete this exercise only if specifically instructed by your caregiver.   Place the balls of your feet on a step. With your hands, use only enough support from a wall or rail to keep your balance.  Keep your knees straight and rise up on your toes.  Slowly shift your weight entirely to your right / left toes and pick up your opposite foot. Gently and with controlled movement, lower your weight through your right / left foot so that your heel drops below the level of the step. You will feel a slight stretch in the back of your calf at the end position.  Use the healthy leg to help rise up onto the balls of both feet, then lower weight only on the right / left leg again. Build up to 15 repetitions. Then progress to 3 consecutive sets of 15 repetitions.*  After completing the above exercise, complete the same exercise with a slight knee bend (about 30 degrees). Again, build up to 15 repetitions. Then progress to 3 consecutive sets of 15 repetitions.* Perform this exercise __________ times per day.  *When you easily complete 3 sets of 15, your physician, physical therapist or athletic trainer may advise you to add resistance by wearing a backpack filled with additional weight. STRENGTH - Plantar Flexors, Seated   Sit on a chair that allows your feet  to rest flat on the ground. If necessary, sit at the edge of the chair.  Keeping your toes firmly on the ground, lift your right / left heel as far as you can without increasing any discomfort in your ankle. Repeat __________ times. Complete this exercise __________ times a day. *If instructed by your physician, physical therapist or athletic trainer, you may add ____________________ of resistance by placing a weighted object on your right / left knee. Document Released: 06/06/2005 Document Revised: 01/28/2012 Document Reviewed: 02/17/2009 [employer] Patient Information 2014 [employer].    

## 2023-03-26 ENCOUNTER — Ambulatory Visit: Payer: Self-pay | Admitting: Podiatry

## 2023-04-22 ENCOUNTER — Ambulatory Visit (INDEPENDENT_AMBULATORY_CARE_PROVIDER_SITE_OTHER): Payer: 59 | Admitting: Podiatry

## 2023-04-22 DIAGNOSIS — M7732 Calcaneal spur, left foot: Secondary | ICD-10-CM

## 2023-04-22 DIAGNOSIS — M7662 Achilles tendinitis, left leg: Secondary | ICD-10-CM | POA: Diagnosis not present

## 2023-04-22 DIAGNOSIS — M9262 Juvenile osteochondrosis of tarsus, left ankle: Secondary | ICD-10-CM | POA: Diagnosis not present

## 2023-04-22 MED ORDER — MELOXICAM 15 MG PO TABS
15.0000 mg | ORAL_TABLET | Freq: Every day | ORAL | 2 refills | Status: DC
Start: 2023-04-22 — End: 2024-08-06

## 2023-04-22 NOTE — Progress Notes (Signed)
Subjective:  Patient ID: Stephanie Chan, female    DOB: October 08, 1966,  MRN: [id number]  Chief Complaint  Patient presents with   Follow-up    Left achilles tendinitis and retrocalcaneal exostosis. Patient is better but the pain is still present. Patient completed the course of steroid pills and continues to take the meloxicam as needed.     57 y.o. female presents  for follow up of left achilles tendinitis and retrocalc exostosis.  Patient reports she has been taking meloxicam and she is doing slightly better however still having a lot of pain especially with walking.  Pain in the back of the left heel.  She has been doing stretching also had a steroid Dosepak.  Does not think the steroid helped at all.  Stretching does help a little bit.  Has been doing home physical therapy but not enough relief to continue with just that.  Has not tried heel lifts yet.  Past Medical History:  Diagnosis Date   Allergy    Bundle branch block    Depression    Migraines    Panic attack    Vertigo     No Known Allergies  ROS: Negative except as per HPI above  Objective:  General: AAO x3, NAD  Dermatological: With inspection and palpation of the right and left lower extremities there are no open sores, no preulcerative lesions, no rash or signs of infection present. Nails are of normal length thickness and coloration.   Vascular:  Dorsalis Pedis artery and Posterior Tibial artery pedal pulses are 2/4 bilateral.  Capillary fill time < 3 sec to all digits.   Neruologic: Grossly intact via light touch bilateral. Protective threshold intact to all sites bilateral.   Musculoskeletal: Decreased pain with palpation of the posterior aspect of the left heel.  Gait: Unassisted, Nonantalgic.   No images are attached to the encounter.  Radiographs:  Date: 03/11/2023 XR the left foot Weightbearing AP/Lateral/Oblique   Findings: Attention directed to the posterior aspect of the left heel there is noted  to be a hypertrophic osseous spur at the insertion point of the Achilles tendon with intratendinous tendinous calcification. Assessment:   1. Achilles tendinitis of left lower extremity   2. Bone spur of posterior portion of left calcaneus   3. Haglund's deformity of left heel       Plan:  Patient was evaluated and treated and all questions answered.  # Retrocalcaneal exostosis and Achilles tendinitis of the left lower extremity mild Haglund's deformity -Patient seen some benefit with meloxicam however she is still having pain especially after she is doing more walking. -Recommend use of heel lifts which were provided to patient x 2 pairs at this visit -Conservatively could try physical therapy however patient has tried stretching and done home PT and does not want to proceed with any further physical therapy. -Discussed continued conservative care versus surgical intervention which would include retrocalcaneal exostectomy and secondary repair of the Achilles of the left foot -Patient wishes to proceed.  I discussed the risk benefits alternatives and Possible complications associate with these procedures.  Discussed the expected postop recovery course and including the need for prolonged nonweightbearing -Informed surgical consent was obtained.  Patient wishes to wait until August or September to proceed with surgery however we will begin surgical planning at this time. -Will send refill meloxicam to get her through until surgical correction   No follow-ups on file.          Corinna Gab, DPM  [facility] / [facility]

## 2023-04-22 NOTE — Addendum Note (Signed)
Addended by: Mindi Curling on: 04/22/2023 09:04 AM   Modules accepted: Orders

## 2023-04-22 NOTE — Addendum Note (Signed)
Addended by: Mindi Curling on: 04/22/2023 08:53 AM   Modules accepted: Orders

## 2023-06-18 ENCOUNTER — Telehealth: Payer: Self-pay | Admitting: Urology

## 2023-06-18 NOTE — Telephone Encounter (Signed)
DOS - 07/17/23  REPAIR POST TIBAL TENDON LEFT --- 82956 CALCANEAL OSTECTOMY LEFT --- 28118   [facility] EFFECTIVE DATE - 04/20/23  PER [employer] WEBSITE FOR CPT CODES 21308 AND 28118 Notification or Prior Authorization is not required for the requested services This [employer] Commercial member's plan does not currently require a prior authorization for these services. If you have general questions about the prior authorization requirements, please call us at [phone] or visit [email or web address] > Clinician Resources > Advance and Admission Notification Requirements. The number above acknowledges your notification. Please write this number down for future reference. Notification is not a guarantee of coverage or payment.   Decision ID #: [id number]

## 2023-07-16 ENCOUNTER — Other Ambulatory Visit (INDEPENDENT_AMBULATORY_CARE_PROVIDER_SITE_OTHER): Payer: 59 | Admitting: Podiatry

## 2023-07-16 DIAGNOSIS — Z9889 Other specified postprocedural states: Secondary | ICD-10-CM

## 2023-07-16 MED ORDER — IBUPROFEN 800 MG PO TABS: 800.0000 mg | ORAL_TABLET | Freq: Three times a day (TID) | ORAL | 0 refills | Status: DC | PRN

## 2023-07-16 MED ORDER — ASPIRIN 81 MG PO TBEC: 81.0000 mg | DELAYED_RELEASE_TABLET | Freq: Two times a day (BID) | ORAL | 0 refills | Status: AC

## 2023-07-16 MED ORDER — OXYCODONE-ACETAMINOPHEN 5-325 MG PO TABS: 1.0000 | ORAL_TABLET | ORAL | 0 refills | Status: AC | PRN

## 2023-07-16 MED ORDER — CEPHALEXIN 500 MG PO CAPS: 500.0000 mg | ORAL_CAPSULE | Freq: Three times a day (TID) | ORAL | 0 refills | Status: AC

## 2023-07-16 NOTE — Progress Notes (Signed)
Post op Rx sent

## 2023-07-17 DIAGNOSIS — M7662 Achilles tendinitis, left leg: Secondary | ICD-10-CM | POA: Diagnosis not present

## 2023-07-17 DIAGNOSIS — M7732 Calcaneal spur, left foot: Secondary | ICD-10-CM | POA: Diagnosis not present

## 2023-07-23 ENCOUNTER — Ambulatory Visit (INDEPENDENT_AMBULATORY_CARE_PROVIDER_SITE_OTHER): Payer: 59

## 2023-07-23 ENCOUNTER — Ambulatory Visit (INDEPENDENT_AMBULATORY_CARE_PROVIDER_SITE_OTHER): Payer: 59 | Admitting: Podiatry

## 2023-07-23 DIAGNOSIS — Z09 Encounter for follow-up examination after completed treatment for conditions other than malignant neoplasm: Secondary | ICD-10-CM

## 2023-07-23 DIAGNOSIS — M7662 Achilles tendinitis, left leg: Secondary | ICD-10-CM

## 2023-07-23 DIAGNOSIS — M7732 Calcaneal spur, left foot: Secondary | ICD-10-CM

## 2023-07-23 DIAGNOSIS — M9262 Juvenile osteochondrosis of tarsus, left ankle: Secondary | ICD-10-CM

## 2023-07-23 NOTE — Progress Notes (Signed)
  Subjective:  Patient ID: Stephanie Chan, female    DOB: 12-16-1965,  MRN: [id number]  Chief Complaint  Patient presents with   Routine Post Op    POV # 1 DOS 07/17/23 --- LEFT FOOT RETROCALCANEAL EXOSTECTOMY, SECONDARY REPAIR OF ACHILLES TENDON    DOS: 07/17/2023 Procedure: Left foot retrocalcaneal exostectomy and secondary repair of Achilles tendon  57 y.o. female returns for post-op check.  Patient reports for first postop check approximately 1 week status post left foot retrocalcaneal exostectomy.  She has been nonweightbearing as instructed in a posterior splint.  Has kept the dressing clean dry and intact.  Has taken prescribed postop medications as instructed. Doing well, pain well controlled.   Review of Systems: Negative except as noted in the HPI. Denies N/V/F/Ch.   Objective:  There were no vitals filed for this visit. There is no height or weight on file to calculate BMI. Constitutional Well developed. Well nourished.  Vascular Foot warm and well perfused. Capillary refill normal to all digits.  Calf is soft and supple, no posterior calf or knee pain, negative Homans' sign  Neurologic Normal speech. Oriented to person, place, and time. Epicritic sensation to light touch grossly present bilaterally.  Dermatologic Skin healing well without signs of infection. Skin edges well coapted without signs of infection.  Orthopedic: Tenderness to palpation noted about the surgical site.   Multiple view plain film radiographs: XR 3 views AP lateral oblique of the left foot.  Nonweightbearing.  Findings:directed the posterior superior aspect of the left calcaneus there is noted to be interval resection of the posterior aspect of the calcaneus. Assessment:   1. Postoperative examination   2. Achilles tendinitis of left lower extremity   3. Bone spur of posterior portion of left calcaneus   4. Haglund's deformity of left heel    Plan:  Patient was evaluated and treated and all  questions answered.  S/p foot surgery left foot retrocalc -Progressing as expected post-operatively. -XR: As above no acute postop complication -WB Status: Nonweightbearing in cam boot with heel wedge in the heel for plantarflexion of the foot -Sutures: To remain intact until next appointment. -Medications: Continue Percocet and ibuprofen as needed for pain -Foot redressed.  Continue to keep foot dry and dressing intact until next appointment  Return in about 1 week (around 07/30/2023) for POV #2.         Corinna Gab, DPM [facility] / [facility]

## 2023-07-30 ENCOUNTER — Encounter: Payer: 59 | Admitting: Podiatry

## 2023-07-30 ENCOUNTER — Ambulatory Visit (INDEPENDENT_AMBULATORY_CARE_PROVIDER_SITE_OTHER): Payer: 59 | Admitting: Podiatry

## 2023-07-30 DIAGNOSIS — M9262 Juvenile osteochondrosis of tarsus, left ankle: Secondary | ICD-10-CM

## 2023-07-30 DIAGNOSIS — M7732 Calcaneal spur, left foot: Secondary | ICD-10-CM

## 2023-07-30 DIAGNOSIS — Z09 Encounter for follow-up examination after completed treatment for conditions other than malignant neoplasm: Secondary | ICD-10-CM

## 2023-07-30 DIAGNOSIS — M7662 Achilles tendinitis, left leg: Secondary | ICD-10-CM

## 2023-07-30 NOTE — Progress Notes (Signed)
  Subjective:  Patient ID: Stephanie Chan, female    DOB: 1966/04/01,  MRN: [id number]  Chief Complaint  Patient presents with   Routine Post Op    POV #2 DOS 07/17/23 --- LEFT FOOT RETROCALCANEAL EXOSTECTOMY, SECONDARY REPAIR OF ACHILLES TENDON    DOS: 07/17/2023 Procedure: Left foot retrocalcaneal exostectomy and secondary repair of Achilles tendon  57 y.o. female returns for post-op check.  Patient reports for first postop check approximately 2 week status post left foot retrocalcaneal exostectomy.  She has been nonweightbearing as instructed in a cam boot since last appointment states pain is well-controlled doing very well no concerns at this time.  Review of Systems: Negative except as noted in the HPI. Denies N/V/F/Ch.   Objective:  There were no vitals filed for this visit. There is no height or weight on file to calculate BMI. Constitutional Well developed. Well nourished.  Vascular Foot warm and well perfused. Capillary refill normal to all digits.  Calf is soft and supple, no posterior calf or knee pain, negative Homans' sign  Neurologic Normal speech. Oriented to person, place, and time. Epicritic sensation to light touch grossly present bilaterally.  Dermatologic Skin healing well without signs of infection. Skin edges well coapted without signs of infection.  Orthopedic: Decreased tenderness to palpation noted about the surgical site.   Multiple view plain film radiographs: Deferred at this visit Assessment:   1. Postoperative examination   2. Achilles tendinitis of left lower extremity   3. Bone spur of posterior portion of left calcaneus   4. Haglund's deformity of left heel     Plan:  Patient was evaluated and treated and all questions answered.  S/p foot surgery left foot retrocalc -Progressing as expected post-operatively. -Overall patient is progressing very well but pain is controlled -XR: Deferred this visit -WB Status: Nonweightbearing in cam  boot with heel wedge in the heel for plantarflexion of the foot -Sutures: Removed in total at this visit and Steri-Strips applied for reinforcement -Medications: ibuprofen as needed for pain -Foot redressed.  Patient is okay to begin getting the left foot and ankle wet with warm soapy water starting this Friday.  Steri-Strips can be removed when they get wet and being to loosen.  Then reapply gauze dressing or adhesive bandage dressing over the incision   Return in about 2 weeks (around 08/13/2023) for 3rd POV L heel retrocalc.         Corinna Gab, DPM [facility] / [facility]

## 2023-08-08 ENCOUNTER — Telehealth: Payer: Self-pay | Admitting: Podiatry

## 2023-08-08 NOTE — Telephone Encounter (Signed)
Pt calling to see if she could get a temporary handicap placard since having surgery. 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

## 2023-08-15 ENCOUNTER — Other Ambulatory Visit: Payer: Self-pay

## 2023-08-15 DIAGNOSIS — Z0000 Encounter for general adult medical examination without abnormal findings: Secondary | ICD-10-CM

## 2023-08-19 ENCOUNTER — Ambulatory Visit (INDEPENDENT_AMBULATORY_CARE_PROVIDER_SITE_OTHER): Payer: 59 | Admitting: Podiatry

## 2023-08-19 ENCOUNTER — Ambulatory Visit (INDEPENDENT_AMBULATORY_CARE_PROVIDER_SITE_OTHER): Payer: 59

## 2023-08-19 DIAGNOSIS — Z09 Encounter for follow-up examination after completed treatment for conditions other than malignant neoplasm: Secondary | ICD-10-CM | POA: Diagnosis not present

## 2023-08-19 DIAGNOSIS — M7662 Achilles tendinitis, left leg: Secondary | ICD-10-CM

## 2023-08-19 DIAGNOSIS — M7732 Calcaneal spur, left foot: Secondary | ICD-10-CM

## 2023-08-19 NOTE — Progress Notes (Signed)
  Subjective:  Patient ID: Stephanie Chan, female    DOB: Jul 16, 1966,  MRN: [id number]  Chief Complaint  Patient presents with   Routine Post Op    POV #3 DOS 07/17/23 --- LEFT FOOT RETROCALCANEAL EXOSTECTOMY, SECONDARY REPAIR OF ACHILLES TENDON      DOS: 07/17/2023 Procedure: Left foot retrocalcaneal exostectomy and secondary repair of Achilles tendon  57 y.o. female returns for post-op check.  Patient reports for postop check approximately 4 week status post left foot retrocalcaneal exostectomy.  She has been nonweightbearing as instructed in a cam boot since last appointment states pain is well-controlled doing very well no concerns at this time.  Overall she has noticed good range of motion with some stiffness with ankle dorsiflexion plantarflexion nonweightbearing range of motion but overall very little pain with that.  Incision  Review of Systems: Negative except as noted in the HPI. Denies N/V/F/Ch.   Objective:  There were no vitals filed for this visit. There is no height or weight on file to calculate BMI. Constitutional Well developed. Well nourished.  Vascular Foot warm and well perfused. Capillary refill normal to all digits.  Calf is soft and supple, no posterior calf or knee pain, negative Homans' sign  Neurologic Normal speech. Oriented to person, place, and time. Epicritic sensation to light touch grossly present bilaterally.  Dermatologic Well-healed incision with minimal scarring  Orthopedic: Decreased tenderness to palpation noted about the surgical site.  Good plantarflexion and dorsiflexion range of motion and strength with the left foot   Multiple view plain film radiographs: 08/19/2023 XR 3 views AP lateral oblique of the left foot.  Weightbearing findings: Interval resection of posterior aspect of the left heel osseous spurring and Haglund's deformity.  No complication noted Assessment:   1. Postoperative examination   2. Achilles tendinitis of left lower  extremity   3. Bone spur of posterior portion of left calcaneus     Plan:  Patient was evaluated and treated and all questions answered.  S/p foot surgery left foot retrocalc -Progressing as expected post-operatively. -Overall patient is progressing very well but pain is controlled -XR: As above no acute postop complication -WB Status: Nonweightbearing in cam boot with heel wedge begin transition back to weightbearing over the next 2 weeks and then weightbearing as tolerated in the boot for an additional 2 weeks eventually removing the heel lift by next appointment -Wear the cam boot for any long distance walking.  No high-impact activities. -Sutures: Previously removed -Medications: ibuprofen as needed for pain    Return in about 6 weeks (around 09/30/2023) for POV 4 L foot retrocalc.         Corinna Gab, DPM [facility] / [facility]

## 2023-08-21 ENCOUNTER — Other Ambulatory Visit: Payer: 59

## 2023-08-22 ENCOUNTER — Telehealth: Payer: Self-pay | Admitting: Podiatry

## 2023-08-22 NOTE — Telephone Encounter (Signed)
Patient called.  She had left posterior calcaneal exostectomy with secondary repair of achilles.  States she saw you last week and was given the green light to start walking in boot with crutches.  She has been doing that.  But, now she is having a lot of pain in the heel (in the back and the back-sides), even after taking advil.  "Is this normal?"  Her foot also feels numb near the back sometimes.  If you tell her it's normal, she says she'll tough it out and keep walking.  She just wants some guidance since she wasn't expecting this amount of pain now.  Call back# [phone]

## 2023-08-28 ENCOUNTER — Encounter: Payer: 59 | Admitting: Family Medicine

## 2023-08-28 ENCOUNTER — Encounter: Payer: Self-pay | Admitting: Family Medicine

## 2023-08-28 NOTE — Progress Notes (Deleted)
Complete physical exam  Patient: Stephanie Chan   DOB: 1966-07-31   57 y.o. Female  MRN: [id number]  Subjective:    No chief complaint on file.   Stephanie Chan is a 56 y.o. female who presents today for a complete physical exam. She reports consuming a {diet types:17450} diet. {types:19826} She generally feels {DESC; WELL/FAIRLY WELL/POORLY:18703}. She reports sleeping {DESC; WELL/FAIRLY WELL/POORLY:18703}. She {does/does not:200015} have additional problems to discuss today.    Most recent fall risk assessment:    02/26/2023    8:52 AM  Fall Risk   Falls in the past year? 1  Number falls in past yr: 0  Injury with Fall? 0  Risk for fall due to : History of fall(s)     Most recent depression and anxiety screenings:    02/26/2023    8:51 AM 08/10/2022    9:35 AM  PHQ 2/9 Scores  PHQ - 2 Score 2 2  PHQ- 9 Score 10 8      02/26/2023    8:52 AM 08/10/2022    9:35 AM 04/05/2022    2:47 PM 05/03/2021    2:51 PM  GAD 7 : Generalized Anxiety Score  Nervous, Anxious, on Edge 1 1 1 3   Control/stop worrying 0 1 1 3   Worry too much - different things 1 1 1 2   Trouble relaxing 1 1 1 1   Restless 0 1 0 1  Easily annoyed or irritable 1 1 2 1   Afraid - awful might happen 0 0 0 0  Total GAD 7 Score 4 6 6 11   Anxiety Difficulty Somewhat difficult Somewhat difficult Somewhat difficult Not difficult at all    Patient Active Problem List   Diagnosis Date Noted   Sleeping difficulties 02/12/2019   Panic anxiety syndrome 10/06/2018   Vitamin D insufficiency 10/05/2016   Perimenopausal hot flashes.  08/08/2016   BMI 30.0-30.9,adult 08/08/2016   Depression 08/08/2016   Environmental and seasonal allergies 08/08/2016    Past Surgical History:  Procedure Laterality Date   CESAREAN SECTION     WISDOM TOOTH EXTRACTION     Social History   Tobacco Use   Smoking status: Never    Passive exposure: Never   Smokeless tobacco: Never  Vaping Use   Vaping status: Never Used   Substance Use Topics   Alcohol use: Yes    Alcohol/week: 2.0 standard drinks of alcohol    Types: 2 Glasses of wine per week    Comment: socially   Drug use: No   Family History  Problem Relation Age of Onset   Thyroid disease Mother 67   Kidney disease Father 72   Heart disease Father 81   Healthy Sister    Soil scientist Daughter    Healthy Sister    Healthy Sister    Healthy Daughter    Healthy Daughter    Colon cancer Neg Hx    Colon polyps Neg Hx    Rectal cancer Neg Hx    Stomach cancer Neg Hx    No Known Allergies   Patient Care Team: Melida Quitter, PA as PCP - General (Family Medicine)   Outpatient Medications Prior to Visit  Medication Sig   acetaminophen (TYLENOL) 500 MG tablet Take 1,000 mg by mouth every 6 (six) hours as needed for moderate pain or headache. Pain   aspirin EC 81 MG tablet Take 81 mg by mouth daily.   B Complex-Biotin-FA (B-COMPLEX PO) Take 1  capsule by mouth daily.   buPROPion (WELLBUTRIN XL) 300 MG 24 hr tablet Take 1 tablet (300 mg total) by mouth daily.   cetirizine-pseudoephedrine (ZYRTEC-D) 5-120 MG per tablet Take 1 tablet by mouth daily as needed for allergies.   Cholecalciferol (VITAMIN D3) 5000 units CAPS Take 1 capsule (5,000 Units total) by mouth daily.   escitalopram (LEXAPRO) 10 MG tablet Take 0.5 tablets (5 mg total) by mouth daily.   fluticasone (FLONASE) 50 MCG/ACT nasal spray Place into the nose.   ibuprofen (ADVIL) 800 MG tablet Take 1 tablet (800 mg total) by mouth every 8 (eight) hours as needed.   ibuprofen (ADVIL,MOTRIN) 200 MG tablet Take 400 mg by mouth every 6 (six) hours as needed. Pain   Magnesium 200 MG TABS Take 1 tablet by mouth daily.   meloxicam (MOBIC) 15 MG tablet Take 1 tablet (15 mg total) by mouth daily.   Misc Natural Products (GLUCOSAMINE CHOND COMPLEX/MSM) TABS Take 1 tablet by mouth daily.   Multiple Vitamin (MULTIVITAMIN) tablet Take 1 tablet by mouth daily.   Omega-3 Fatty Acids  (FISH OIL) 1200 MG CAPS Take 1 capsule by mouth daily.   scopolamine (TRANSDERM-SCOP) 1 MG/3DAYS Place 1 patch (1.5 mg total) onto the skin every 3 (three) days.   No facility-administered medications prior to visit.    ROS    Objective:    There were no vitals taken for this visit.   Physical Exam   No results found for any visits on 08/28/23.    Assessment & Plan:    Routine Health Maintenance and Physical Exam  Immunization History  Administered Date(s) Administered   Influenza,inj,Quad PF,6+ Mos 10/22/2017, 10/06/2018   Tdap 12/26/2012, 02/26/2023    Health Maintenance  Topic Date Due   HIV Screening  Never done   Hepatitis C Screening  Never done   MAMMOGRAM  Never done   Zoster Vaccines- Shingrix (1 of 2) Never done   INFLUENZA VACCINE  06/20/2023   COVID-19 Vaccine (1 - 2023-24 season) Never done   Cervical Cancer Screening (HPV/Pap Cotest)  01/11/2026   Colonoscopy  12/19/2027   DTaP/Tdap/Td (3 - Td or Tdap) 02/25/2033   HPV VACCINES  Aged Out    Reviewed most recent labs including CBC, CMP, lipid panel, A1C, TSH, and vitamin D. All within normal limits/stable from last check other than ***. Discussed health benefits of physical activity, and encouraged her to engage in regular exercise appropriate for her age and condition.  There are no diagnoses linked to this encounter.  No follow-ups on file.     Melida Quitter, PA

## 2023-09-09 ENCOUNTER — Encounter: Payer: Self-pay | Admitting: Podiatry

## 2023-09-30 ENCOUNTER — Encounter: Payer: Self-pay | Admitting: Podiatry

## 2023-09-30 ENCOUNTER — Ambulatory Visit (INDEPENDENT_AMBULATORY_CARE_PROVIDER_SITE_OTHER): Payer: 59 | Admitting: Podiatry

## 2023-09-30 DIAGNOSIS — M7732 Calcaneal spur, left foot: Secondary | ICD-10-CM

## 2023-09-30 DIAGNOSIS — Z09 Encounter for follow-up examination after completed treatment for conditions other than malignant neoplasm: Secondary | ICD-10-CM

## 2023-09-30 DIAGNOSIS — M7662 Achilles tendinitis, left leg: Secondary | ICD-10-CM

## 2023-09-30 NOTE — Progress Notes (Signed)
  Subjective:  Patient ID: Stephanie Chan, female    DOB: 12/10/1965,  MRN: [id number]  No chief complaint on file.   DOS: 07/17/2023 Procedure: Left foot retrocalcaneal exostectomy and secondary repair of Achilles tendon  57 y.o. female returns for post-op check.  Patient reports for postop check approximately 10 week status post left foot retrocalcaneal exostectomy.  Patient been weightbearing as tolerated in cam boot.  She is improving overall but does have some pain on the medial aspect of the heel.  Says it feels tight like there is something pulling.  Swelling is decreased however and she is doing better overall  Review of Systems: Negative except as noted in the HPI. Denies N/V/F/Ch.   Objective:  There were no vitals filed for this visit. There is no height or weight on file to calculate BMI. Constitutional Well developed. Well nourished.  Vascular Foot warm and well perfused. Capillary refill normal to all digits.  Calf is soft and supple, no posterior calf or knee pain, negative Homans' sign  Neurologic Normal speech. Oriented to person, place, and time. Epicritic sensation to light touch grossly present bilaterally.  Dermatologic Well-healed incision with minimal scarring  Orthopedic: Decreased tenderness to palpation noted about the surgical site.  Good plantarflexion and dorsiflexion range of motion and strength with the left foot.  Slight tenderness to palpation noted at the medial inferior anchor point for the Achilles tendon repair however no palpable prominence noted   Multiple view plain film radiographs: Deferred at this visit Assessment:   1. Postoperative examination   2. Achilles tendinitis of left lower extremity   3. Bone spur of posterior portion of left calcaneus      Plan:  Patient was evaluated and treated and all questions answered.  S/p foot surgery left foot retrocalc -Progressing as expected post-operatively. -Believe some of her residual  pain on the medial posterior aspect of the heel is from the medial inferior anchor point for the Achilles tendon repair.  Hopeful that this will loosen up and decrease being painful with time as the tension decreases -Will begin physical therapy once weekly for next 6 weeks patient provided with external referral for physical therapy -XR: Will take new as the next appointment -WB Status: Begin transition out of the cam boot to regular shoe gear with a heel lift -Continue to avoid high-impact activities -Medications: ibuprofen as needed for pain         Corinna Gab, DPM [facility] / [facility]

## 2023-10-02 ENCOUNTER — Other Ambulatory Visit: Payer: 59

## 2023-10-02 DIAGNOSIS — E559 Vitamin D deficiency, unspecified: Secondary | ICD-10-CM

## 2023-10-02 DIAGNOSIS — Z0000 Encounter for general adult medical examination without abnormal findings: Secondary | ICD-10-CM

## 2023-10-03 LAB — COMPREHENSIVE METABOLIC PANEL
ALT: 20 [IU]/L (ref 0–32)
AST: 17 [IU]/L (ref 0–40)
Albumin: 4.6 g/dL (ref 3.8–4.9)
Alkaline Phosphatase: 122 [IU]/L — ABNORMAL HIGH (ref 44–121)
BUN/Creatinine Ratio: 26 — ABNORMAL HIGH (ref 9–23)
BUN: 22 mg/dL (ref 6–24)
Bilirubin Total: 0.3 mg/dL (ref 0.0–1.2)
CO2: 23 mmol/L (ref 20–29)
Calcium: 9.1 mg/dL (ref 8.7–10.2)
Chloride: 103 mmol/L (ref 96–106)
Creatinine, Ser: 0.84 mg/dL (ref 0.57–1.00)
Globulin, Total: 2.8 g/dL (ref 1.5–4.5)
Glucose: 94 mg/dL (ref 70–99)
Potassium: 4.5 mmol/L (ref 3.5–5.2)
Sodium: 141 mmol/L (ref 134–144)
Total Protein: 7.4 g/dL (ref 6.0–8.5)
eGFR: 82 mL/min/{1.73_m2} (ref >59)

## 2023-10-03 LAB — CBC WITH DIFFERENTIAL/PLATELET
Basophils Absolute: 0 10*3/uL (ref 0.0–0.2)
Basos: 0 %
EOS (ABSOLUTE): 0.1 10*3/uL (ref 0.0–0.4)
Eos: 4 %
Hematocrit: 40 % (ref 34.0–46.6)
Hemoglobin: 13.6 g/dL (ref 11.1–15.9)
Immature Grans (Abs): 0 10*3/uL (ref 0.0–0.1)
Immature Granulocytes: 0 %
Lymphocytes Absolute: 1.3 10*3/uL (ref 0.7–3.1)
Lymphs: 34 %
MCH: 30.8 pg (ref 26.6–33.0)
MCHC: 34 g/dL (ref 31.5–35.7)
MCV: 91 fL (ref 79–97)
Monocytes Absolute: 0.3 10*3/uL (ref 0.1–0.9)
Monocytes: 8 %
Neutrophils Absolute: 2.1 10*3/uL (ref 1.4–7.0)
Neutrophils: 54 %
Platelets: 203 10*3/uL (ref 150–450)
RBC: 4.42 x10E6/uL (ref 3.77–5.28)
RDW: 12.6 % (ref 11.7–15.4)
WBC: 3.9 10*3/uL (ref 3.4–10.8)

## 2023-10-03 LAB — HEMOGLOBIN A1C
Est. average glucose Bld gHb Est-mCnc: 103 mg/dL
Hgb A1c MFr Bld: 5.2 % (ref 4.8–5.6)

## 2023-10-03 LAB — LIPID PANEL
Chol/HDL Ratio: 3.9 ratio (ref 0.0–4.4)
Cholesterol, Total: 220 mg/dL — ABNORMAL HIGH (ref 100–199)
HDL: 57 mg/dL (ref >39)
LDL Chol Calc (NIH): 142 mg/dL — ABNORMAL HIGH (ref 0–99)
Triglycerides: 119 mg/dL (ref 0–149)
VLDL Cholesterol Cal: 21 mg/dL (ref 5–40)

## 2023-10-03 LAB — TSH: TSH: 1.04 u[IU]/mL (ref 0.450–4.500)

## 2023-10-03 LAB — VITAMIN D 25 HYDROXY (VIT D DEFICIENCY, FRACTURES): Vit D, 25-Hydroxy: 61.5 ng/mL (ref 30.0–100.0)

## 2023-10-09 ENCOUNTER — Encounter: Payer: Self-pay | Admitting: Family Medicine

## 2023-10-09 ENCOUNTER — Ambulatory Visit (INDEPENDENT_AMBULATORY_CARE_PROVIDER_SITE_OTHER): Payer: 59 | Admitting: Family Medicine

## 2023-10-09 VITALS — BP 128/77 | HR 89 | Resp 18 | Ht 67.0 in | Wt 200.0 lb

## 2023-10-09 DIAGNOSIS — Z0000 Encounter for general adult medical examination without abnormal findings: Secondary | ICD-10-CM | POA: Diagnosis not present

## 2023-10-09 DIAGNOSIS — E7800 Pure hypercholesterolemia, unspecified: Secondary | ICD-10-CM | POA: Diagnosis not present

## 2023-10-09 DIAGNOSIS — F32A Depression, unspecified: Secondary | ICD-10-CM | POA: Diagnosis not present

## 2023-10-09 DIAGNOSIS — Z1231 Encounter for screening mammogram for malignant neoplasm of breast: Secondary | ICD-10-CM | POA: Diagnosis not present

## 2023-10-09 DIAGNOSIS — Z1159 Encounter for screening for other viral diseases: Secondary | ICD-10-CM

## 2023-10-09 MED ORDER — BUPROPION HCL ER (XL) 300 MG PO TB24: 300.0000 mg | ORAL_TABLET | Freq: Every day | ORAL | 1 refills | Status: DC

## 2023-10-09 MED ORDER — ESCITALOPRAM OXALATE 10 MG PO TABS: 5.0000 mg | ORAL_TABLET | Freq: Every day | ORAL | 1 refills | Status: DC

## 2023-10-09 NOTE — Progress Notes (Signed)
Complete physical exam  Patient: Stephanie Chan   DOB: 06-21-1966   57 y.o. Female  MRN: [id number]  Subjective:    Chief Complaint  Patient presents with   Annual Exam    Stephanie Chan is a 57 y.o. female who presents today for a complete physical exam. She reports consuming a general diet.  She had surgery on her left foot about 2 months ago and is still going through the recovery process.  She has not been as active as she usually is during recovery but looks forward to fully healing and starting her routine.  She generally feels well. She reports sleeping poorly, waking up several times during the night.  She is considering trying to take hydroxyzine at bedtime. She does not have additional problems to discuss today.    Most recent fall risk assessment:    02/26/2023    8:52 AM  Fall Risk   Falls in the past year? 1  Number falls in past yr: 0  Injury with Fall? 0  Risk for fall due to : History of fall(s)     Most recent depression and anxiety screenings:    10/09/2023    1:13 PM 02/26/2023    8:51 AM  PHQ 2/9 Scores  PHQ - 2 Score 2 2  PHQ- 9 Score 10 10      10/09/2023    1:13 PM 02/26/2023    8:52 AM 08/10/2022    9:35 AM 04/05/2022    2:47 PM  GAD 7 : Generalized Anxiety Score  Nervous, Anxious, on Edge 2 1 1 1   Control/stop worrying 0 0 1 1  Worry too much - different things 1 1 1 1   Trouble relaxing 1 1 1 1   Restless 1 0 1 0  Easily annoyed or irritable 2 1 1 2   Afraid - awful might happen 1 0 0 0  Total GAD 7 Score 8 4 6 6   Anxiety Difficulty Somewhat difficult Somewhat difficult Somewhat difficult Somewhat difficult    Patient Active Problem List   Diagnosis Date Noted   Pure hypercholesterolemia 10/09/2023   Sleeping difficulties 02/12/2019   Panic anxiety syndrome 10/06/2018   Vitamin D insufficiency 10/05/2016   Perimenopausal hot flashes.  08/08/2016   BMI 30.0-30.9,adult 08/08/2016   Depression 08/08/2016   Environmental and seasonal  allergies 08/08/2016    Past Surgical History:  Procedure Laterality Date   CESAREAN SECTION     WISDOM TOOTH EXTRACTION     Social History   Tobacco Use   Smoking status: Never    Passive exposure: Never   Smokeless tobacco: Never  Vaping Use   Vaping status: Never Used  Substance Use Topics   Alcohol use: Yes    Alcohol/week: 2.0 standard drinks of alcohol    Types: 2 Glasses of wine per week    Comment: socially   Drug use: No   Family History  Problem Relation Age of Onset   Thyroid disease Mother 24   Kidney disease Father 74   Heart disease Father 42   Healthy Sister    Soil scientist Daughter    Healthy Sister    Healthy Sister    Healthy Daughter    Healthy Daughter    Colon cancer Neg Hx    Colon polyps Neg Hx    Rectal cancer Neg Hx    Stomach cancer Neg Hx    No Known Allergies   Patient Care Team: Cruger,  Simmie Davies, PA as PCP - General (Family Medicine)   Outpatient Medications Prior to Visit  Medication Sig   acetaminophen (TYLENOL) 500 MG tablet Take 1,000 mg by mouth every 6 (six) hours as needed for moderate pain or headache. Pain   aspirin EC 81 MG tablet Take 81 mg by mouth daily.   B Complex-Biotin-FA (B-COMPLEX PO) Take 1 capsule by mouth daily.   cetirizine-pseudoephedrine (ZYRTEC-D) 5-120 MG per tablet Take 1 tablet by mouth daily as needed for allergies.   Cholecalciferol (VITAMIN D3) 5000 units CAPS Take 1 capsule (5,000 Units total) by mouth daily.   ibuprofen (ADVIL) 800 MG tablet Take 1 tablet (800 mg total) by mouth every 8 (eight) hours as needed.   ibuprofen (ADVIL,MOTRIN) 200 MG tablet Take 400 mg by mouth every 6 (six) hours as needed. Pain   Magnesium 200 MG TABS Take 1 tablet by mouth daily.   meloxicam (MOBIC) 15 MG tablet Take 1 tablet (15 mg total) by mouth daily.   Misc Natural Products (GLUCOSAMINE CHOND COMPLEX/MSM) TABS Take 1 tablet by mouth daily.   Multiple Vitamin (MULTIVITAMIN) tablet Take 1 tablet by  mouth daily.   Omega-3 Fatty Acids (FISH OIL) 1200 MG CAPS Take 1 capsule by mouth daily.   scopolamine (TRANSDERM-SCOP) 1 MG/3DAYS Place 1 patch (1.5 mg total) onto the skin every 3 (three) days.   [DISCONTINUED] buPROPion (WELLBUTRIN XL) 300 MG 24 hr tablet Take 1 tablet (300 mg total) by mouth daily.   [DISCONTINUED] escitalopram (LEXAPRO) 10 MG tablet Take 0.5 tablets (5 mg total) by mouth daily.   fluticasone (FLONASE) 50 MCG/ACT nasal spray Place into the nose.   No facility-administered medications prior to visit.    Review of Systems  Constitutional:  Negative for chills, fever and malaise/fatigue.  HENT:  Negative for congestion and hearing loss.   Eyes:  Negative for blurred vision and double vision.  Respiratory:  Negative for cough and shortness of breath.   Cardiovascular:  Negative for chest pain, palpitations and leg swelling.  Gastrointestinal:  Negative for abdominal pain, constipation, diarrhea and heartburn.  Genitourinary:  Negative for frequency and urgency.  Musculoskeletal:  Negative for myalgias and neck pain.  Neurological:  Negative for headaches.  Endo/Heme/Allergies:  Negative for polydipsia.  Psychiatric/Behavioral:  Positive for depression. The patient is nervous/anxious and has insomnia.       Objective:    BP 128/77 (BP Location: Left Arm, Patient Position: Sitting, Cuff Size: Normal)   Pulse 89   Resp 18   Ht 5\' 7"  (1.702 m)   Wt 200 lb (90.7 kg)   SpO2 96%   BMI 31.32 kg/m    Physical Exam Constitutional:      General: She is not in acute distress.    Appearance: Normal appearance.  HENT:     Head: Normocephalic and atraumatic.     Right Ear: Tympanic membrane, ear canal and external ear normal.     Left Ear: Tympanic membrane, ear canal and external ear normal.     Nose: Nose normal.     Mouth/Throat:     Mouth: Mucous membranes are moist.     Pharynx: No oropharyngeal exudate or posterior oropharyngeal erythema.  Eyes:      Extraocular Movements: Extraocular movements intact.     Conjunctiva/sclera: Conjunctivae normal.     Pupils: Pupils are equal, round, and reactive to light.     Comments: Wearing glasses  Neck:     Thyroid: No thyroid mass, thyromegaly or  thyroid tenderness.  Cardiovascular:     Rate and Rhythm: Normal rate and regular rhythm.     Heart sounds: Normal heart sounds. No murmur heard.    No friction rub. No gallop.  Pulmonary:     Effort: Pulmonary effort is normal. No respiratory distress.     Breath sounds: Normal breath sounds. No wheezing, rhonchi or rales.  Abdominal:     General: Abdomen is flat. Bowel sounds are normal. There is no distension.     Palpations: There is no mass.     Tenderness: There is no abdominal tenderness. There is no guarding.  Musculoskeletal:        General: Normal range of motion.     Cervical back: Normal range of motion and neck supple.  Lymphadenopathy:     Cervical: No cervical adenopathy.  Skin:    General: Skin is warm and dry.     Comments: Vertical scar over Achilles tendon/calcaneal bone on left foot  Neurological:     Mental Status: She is alert and oriented to person, place, and time.     Cranial Nerves: No cranial nerve deficit.     Motor: No weakness.     Deep Tendon Reflexes: Reflexes normal.  Psychiatric:        Mood and Affect: Mood normal.        Assessment & Plan:    Routine Health Maintenance and Physical Exam  Immunization History  Administered Date(s) Administered   Influenza,inj,Quad PF,6+ Mos 10/22/2017, 10/06/2018   Tdap 12/26/2012, 02/26/2023    Health Maintenance  Topic Date Due   HIV Screening  Never done   Hepatitis C Screening  Never done   MAMMOGRAM  Never done   Zoster Vaccines- Shingrix (1 of 2) Never done   INFLUENZA VACCINE  06/20/2023   COVID-19 Vaccine (1 - 2023-24 season) 10/25/2023 (Originally 07/21/2023)   Cervical Cancer Screening (HPV/Pap Cotest)  01/11/2026   Colonoscopy  12/19/2027    DTaP/Tdap/Td (3 - Td or Tdap) 02/25/2033   HPV VACCINES  Aged Out    Reviewed most recent labs including CBC, CMP, lipid panel, A1C, TSH, and vitamin D. All within normal limits/stable from last check other than LDL increased to 142, BUN/creatinine ratio 26, alkaline phosphatase is slightly elevated 122. Agreeable to mammogram being placed. Discussed recommendation for shingles vaccine.  Patient has placed it on her to do list and will get it on a day she will be able to rest if she does have any reactions to the vaccine.  Discussed health benefits of physical activity, and encouraged her to engage in regular exercise appropriate for her age and condition.  Wellness examination  Depression, unspecified depression type Assessment & Plan: PHQ-9 score of 10, GAD-7 score worsened to 8.  Patient feels mood is stable and would like to continue with her current regimen.  Continue Lexapro 5 mg daily, Wellbutrin 300 mg daily, hydroxyzine as needed.  Will continue to monitor.  Orders: -     buPROPion HCl ER (XL); Take 1 tablet (300 mg total) by mouth daily.  Dispense: 90 tablet; Refill: 1 -     Escitalopram Oxalate; Take 0.5 tablets (5 mg total) by mouth daily.  Dispense: 45 tablet; Refill: 1  Pure hypercholesterolemia Assessment & Plan: Last lipid panel: LDL 142, HDL 57, triglycerides 119.  LDL increased significantly from 113.  Recheck in 6 months and adjust management as indicated by results.  Orders: -     Lipid panel; Future -  Comprehensive metabolic panel; Future  Screening mammogram for breast cancer -     3D Screening Mammogram, Left and Right; Future    Return in about 6 months (around 04/07/2024) for follow-up for mood and cholesterol, fasting blood work 1 week before.     Melida Quitter, PA

## 2023-10-09 NOTE — Assessment & Plan Note (Signed)
PHQ-9 score of 10, GAD-7 score worsened to 8.  Patient feels mood is stable and would like to continue with her current regimen.  Continue Lexapro 5 mg daily, Wellbutrin 300 mg daily, hydroxyzine as needed.  Will continue to monitor.

## 2023-10-09 NOTE — Assessment & Plan Note (Signed)
Last lipid panel: LDL 142, HDL 57, triglycerides 119.  LDL increased significantly from 113.  Recheck in 6 months and adjust management as indicated by results.

## 2023-10-09 NOTE — Patient Instructions (Signed)
You should be getting a call to schedule your mammogram.  You can get your 2 shingles shots at the pharmacy or in our office.  The 2 shots should be 2-6 months apart.

## 2023-10-16 ENCOUNTER — Encounter: Payer: Self-pay | Admitting: Podiatry

## 2023-10-20 NOTE — Telephone Encounter (Signed)
Post Operative Protocol and Physical Therapy Plan following Retrocalcaneal Exostectomy with Secondary Achilles Tendon Repair (Haglund's Repair Surgery)   ## Immediate Post-operative Phase  ### Weeks 0-2 - Non-weight bearing with crutches or walker  - Foot positioned in plantarflexion  - Use of protective boot or cast  ## Early Rehabilitation Phase  ### Weeks 2-6 - Gradual transition to partial weight bearing - Maintain foot in plantarflexion  - Begin gentle range of motion exercises, avoiding dorsiflexion beyond neutral   ### Weeks 6-8 - Progress to full weight bearing as tolerated in CAM boot - Continue range of motion exercises, still avoiding excessive dorsiflexion  ## Intermediate Rehabilitation Phase  ### Weeks 8-10 - Begin concentric bilateral heel raises - Initiate low-impact activities   ### Weeks 10-12 - Introduce 1/8" to 1/4" heel cushions in daily shoes transitioning out CAM boot - Progress strengthening exercises - Consider use of antigravity treadmill for gait training  ## Advanced Rehabilitation Phase  ### Weeks 12-16 - Begin stretching and eccentric exercises  - Progress to more challenging balance and proprioception activities - Increase intensity of strengthening exercises  ### Weeks 16-24 - Gradual return to sport-specific activities/higher impact activity - Continue strengthening and flexibility exercises  ## Return to Sport/Full activity  ### Week 24 and beyond - Assess readiness for return to sports/high intensity activity based on heel raise repetitions and functional testing  ## Modalities and Additional Interventions  - Cryotherapy for pain and swelling management - Manual therapy techniques as appropriate - Electrical stimulation for muscle re-education and pain control - Ultrasound for tissue healing (if indicated)

## 2023-10-29 ENCOUNTER — Ambulatory Visit
Admission: RE | Admit: 2023-10-29 | Discharge: 2023-10-29 | Disposition: A | Discharge status: Home / Self Care | Payer: 59 | Source: Ambulatory Visit | Attending: Family Medicine | Admitting: Family Medicine

## 2023-10-29 DIAGNOSIS — Z1231 Encounter for screening mammogram for malignant neoplasm of breast: Secondary | ICD-10-CM

## 2023-11-11 ENCOUNTER — Ambulatory Visit (INDEPENDENT_AMBULATORY_CARE_PROVIDER_SITE_OTHER): Payer: 59 | Admitting: Podiatry

## 2023-11-11 ENCOUNTER — Ambulatory Visit (INDEPENDENT_AMBULATORY_CARE_PROVIDER_SITE_OTHER): Payer: 59

## 2023-11-11 DIAGNOSIS — M7732 Calcaneal spur, left foot: Secondary | ICD-10-CM

## 2023-11-11 DIAGNOSIS — M7662 Achilles tendinitis, left leg: Secondary | ICD-10-CM | POA: Diagnosis not present

## 2023-11-11 DIAGNOSIS — M9262 Juvenile osteochondrosis of tarsus, left ankle: Secondary | ICD-10-CM

## 2023-11-13 NOTE — Progress Notes (Signed)
  Subjective:  Patient ID: Stephanie Chan, female    DOB: 1966-05-29,  MRN: [id number]  Chief Complaint  Patient presents with   Foot Pain    Fu retrocalc. Pain is 8/10 sometimes, but currently 1/10.     DOS: 07/17/2023 Procedure: Left foot retrocalcaneal exostectomy and secondary repair of Achilles tendon  57 y.o. female returns for post-op check.  Patient reports for postop check approximately 4 months status post left foot retrocalcaneal exostectomy.  Patient been weightbearing as tolerated in in regular.  She is improving overall but does have some pain on the medial aspect of the heel.  Says it feels tight like there is something pulling.  She does still report some localized pain and swelling to the medial aspect of the surgical site at this point.  She has been participating physical therapy and believes that this has been helping.  Review of Systems: Negative except as noted in the HPI. Denies N/V/F/Ch.   Objective:  There were no vitals filed for this visit. There is no height or weight on file to calculate BMI. Constitutional Well developed. Well nourished.  Vascular Foot warm and well perfused. Capillary refill normal to all digits.  Calf is soft and supple, no posterior calf or knee pain, negative Homans' sign  Neurologic Normal speech. Oriented to person, place, and time. Epicritic sensation to light touch grossly present bilaterally.  Dermatologic Well-healed incision with minimal scarring  Orthopedic: Decreased tenderness to palpation noted about the surgical site.  Good plantarflexion and dorsiflexion range of motion and strength with the left foot.  Tenderness to palpation noted at the medial inferior anchor point for the Achilles tendon repair however no palpable prominence noted   Multiple view plain film radiographs 11/08/23: Unchanged from previous. Normal osseous mineralization.  No fractures or acute osseous abnormalities.  Demonstrate interval resection of  posterior aspect of the left heel osseous spurring from Haglund's deformity. Assessment:   1. Haglund's deformity of left heel   2. Achilles tendinitis of left lower extremity   3. Bone spur of posterior portion of left calcaneus      Plan:  Patient was evaluated and treated and all questions answered.  S/p foot surgery left foot retrocalc -Progressing well at this point -Believe some of her residual pain on the medial posterior aspect of the heel is from the medial inferior anchor point for the Achilles tendon repair.  Hopeful that this will loosen up and decrease being painful with time as the tension decreases. Advised patient that he is encouraged to resorb over the course of 6 months and that any pain that she is having from possible anchor prominence should continue to improve. -Continue physical therapy and home stretching regimen -XR: Reviewed as discussed above -WB Status: As tolerated -Continue to avoid high-impact activities -Medications: ibuprofen as needed for pain -Recommend massaging surgical scar with vitamin E oil 1-2 times daily -Follow-up in 1 to 2 months        Bronwen Betters, DPM [facility] / [facility]

## 2023-12-23 ENCOUNTER — Ambulatory Visit (INDEPENDENT_AMBULATORY_CARE_PROVIDER_SITE_OTHER): Payer: 59 | Admitting: Podiatry

## 2023-12-23 ENCOUNTER — Encounter: Payer: Self-pay | Admitting: Podiatry

## 2023-12-23 DIAGNOSIS — M7732 Calcaneal spur, left foot: Secondary | ICD-10-CM | POA: Diagnosis not present

## 2023-12-23 DIAGNOSIS — M9262 Juvenile osteochondrosis of tarsus, left ankle: Secondary | ICD-10-CM

## 2023-12-23 NOTE — Progress Notes (Unsigned)
Doing better overall.  Some hypertrophic scar.  Does get some tenderness over the anchor site that can be shooting and numb feeling. Doing better in shoes Some achilles thickening  Achilles tendinitis sleeve dispensed today.  Follow-up as needed

## 2023-12-26 ENCOUNTER — Encounter: Payer: Self-pay | Admitting: Family Medicine

## 2024-03-23 ENCOUNTER — Other Ambulatory Visit: Payer: 59

## 2024-03-30 ENCOUNTER — Ambulatory Visit: Payer: 59 | Admitting: Family Medicine

## 2024-04-14 ENCOUNTER — Ambulatory Visit: Payer: Self-pay | Admitting: Family Medicine

## 2024-04-14 ENCOUNTER — Telehealth: Admitting: Family Medicine

## 2024-04-14 ENCOUNTER — Other Ambulatory Visit: Payer: Self-pay

## 2024-04-14 DIAGNOSIS — R112 Nausea with vomiting, unspecified: Secondary | ICD-10-CM

## 2024-04-14 DIAGNOSIS — L237 Allergic contact dermatitis due to plants, except food: Secondary | ICD-10-CM

## 2024-04-14 DIAGNOSIS — Z87898 Personal history of other specified conditions: Secondary | ICD-10-CM

## 2024-04-14 MED ORDER — SCOPOLAMINE 1 MG/3DAYS TD PT72: 1.0000 | MEDICATED_PATCH | TRANSDERMAL | 0 refills | Status: AC

## 2024-04-14 NOTE — Telephone Encounter (Signed)
 Patient requesting refill on medication, please fill if appropriate.

## 2024-04-14 NOTE — Telephone Encounter (Signed)
 Rx sent. Thanks

## 2024-04-14 NOTE — Telephone Encounter (Signed)
 Chief Complaint: Rash on face since yesterday  Symptoms: Itching (mild), raised red bumps with a little pus, pouch under eye swollen, watery eyes  Disposition: [x] Urgent Care (no appt availability in office)   Additional Notes: Patient scheduled for a virtual urgent care appointment today as no appointment availability in office. This RN educated pt on new-worsening symptoms and when to call back/seek emergent care. Pt verbalized understanding and agrees to plan.    Patient needs a refill of scopolamine  as below.   Scopolamine  patch refill sent to: [facility] at [address] [place], [place] [address] [phone]   Copied from CRM [id number]. Topic: Clinical - Medical Advice >> Apr 14, 2024 10:25 AM Chrystal Crape R wrote: Pt has been exposed to poison ivy and it is effecting her face, Reason for Disposition  [1] Severe poison ivy, oak, or sumac reaction in the past AND [2] face or genitals involved  Answer Assessment - Initial Assessment Questions Chief Complaint: Rash on face since yesterday  Symptoms: Itching (mild), raised red bumps with a little pus, pouch under eye swollen, watery eyes  Protocols used: Poison Ivy - Oak - [facility]

## 2024-04-14 NOTE — Progress Notes (Signed)
 [place]  No Show  Can not treat pt given out of state in [place]. She will need to be seen at a local UC  EV was placed earlier and same response was given.

## 2024-04-14 NOTE — Progress Notes (Signed)
  Because you are outside of [place] and [place] our condition warrants further evaluation in person and I recommend that you be seen in a face-to-face visit.   NOTE: There will be NO CHARGE for this E-Visit   If you are having a true medical emergency, please call 911.

## 2024-05-08 ENCOUNTER — Other Ambulatory Visit: Payer: Self-pay | Admitting: *Deleted

## 2024-05-08 DIAGNOSIS — Z1159 Encounter for screening for other viral diseases: Secondary | ICD-10-CM

## 2024-05-08 DIAGNOSIS — E7800 Pure hypercholesterolemia, unspecified: Secondary | ICD-10-CM

## 2024-05-08 DIAGNOSIS — Z6830 Body mass index (BMI) 30.0-30.9, adult: Secondary | ICD-10-CM

## 2024-05-14 ENCOUNTER — Other Ambulatory Visit

## 2024-05-14 DIAGNOSIS — Z1159 Encounter for screening for other viral diseases: Secondary | ICD-10-CM

## 2024-05-14 DIAGNOSIS — E7800 Pure hypercholesterolemia, unspecified: Secondary | ICD-10-CM

## 2024-05-14 DIAGNOSIS — Z6830 Body mass index (BMI) 30.0-30.9, adult: Secondary | ICD-10-CM

## 2024-05-15 ENCOUNTER — Ambulatory Visit: Payer: Self-pay

## 2024-05-15 LAB — COMPREHENSIVE METABOLIC PANEL WITH GFR
ALT: 22 IU/L (ref 0–32)
AST: 19 IU/L (ref 0–40)
Albumin: 4.4 g/dL (ref 3.8–4.9)
Alkaline Phosphatase: 109 IU/L (ref 44–121)
BUN/Creatinine Ratio: 16 (ref 9–23)
BUN: 13 mg/dL (ref 6–24)
Bilirubin Total: 0.3 mg/dL (ref 0.0–1.2)
CO2: 22 mmol/L (ref 20–29)
Calcium: 8.9 mg/dL (ref 8.7–10.2)
Chloride: 103 mmol/L (ref 96–106)
Creatinine, Ser: 0.82 mg/dL (ref 0.57–1.00)
Globulin, Total: 2.6 g/dL (ref 1.5–4.5)
Glucose: 87 mg/dL (ref 70–99)
Potassium: 4.2 mmol/L (ref 3.5–5.2)
Sodium: 141 mmol/L (ref 134–144)
Total Protein: 7 g/dL (ref 6.0–8.5)
eGFR: 84 mL/min/1.73

## 2024-05-15 LAB — LIPID PANEL
Chol/HDL Ratio: 3.6 ratio (ref 0.0–4.4)
Cholesterol, Total: 208 mg/dL — ABNORMAL HIGH (ref 100–199)
HDL: 57 mg/dL (ref >39)
LDL Chol Calc (NIH): 139 mg/dL — ABNORMAL HIGH (ref 0–99)
Triglycerides: 66 mg/dL (ref 0–149)
VLDL Cholesterol Cal: 12 mg/dL (ref 5–40)

## 2024-05-15 LAB — HEPATITIS C ANTIBODY: Hep C Virus Ab: NONREACTIVE

## 2024-05-15 LAB — HIV ANTIBODY (ROUTINE TESTING W REFLEX): HIV Screen 4th Generation wRfx: NONREACTIVE

## 2024-05-18 ENCOUNTER — Ambulatory Visit (INDEPENDENT_AMBULATORY_CARE_PROVIDER_SITE_OTHER)

## 2024-05-18 ENCOUNTER — Ambulatory Visit

## 2024-05-18 VITALS — BP 96/60 | HR 79 | Ht 67.0 in | Wt 203.2 lb

## 2024-05-18 DIAGNOSIS — F32A Depression, unspecified: Secondary | ICD-10-CM | POA: Diagnosis not present

## 2024-05-18 DIAGNOSIS — E7800 Pure hypercholesterolemia, unspecified: Secondary | ICD-10-CM

## 2024-05-18 DIAGNOSIS — F410 Panic disorder [episodic paroxysmal anxiety] without agoraphobia: Secondary | ICD-10-CM

## 2024-05-18 NOTE — Assessment & Plan Note (Signed)
 GAD-7 score improved from 8 to 6.  Patient feels that mood/anxiety is stable.  Continue Lexapro  5 mg daily, Wellbutrin  300 mg daily, hydroxyzine  as needed.  Will continue to monitor.

## 2024-05-18 NOTE — Patient Instructions (Signed)
 It was nice to see you today!  As we discussed in clinic:  -Continue all of your current medications as prescribed.  -Your cholesterol has improved since the last time we checked it! I recommend continuing with the fish oil supplement and adding red yeast rice supplement to your daily medication regimen for cholesterol support. Also continue exercising and avoiding foods high in saturated/trans fats. -We will plan for your physical in 6 months with labs the week before! It was nice to meet you!  If you have any problems before your next visit feel free to message me via MyChart (minor issues or questions) or call the office, otherwise you may reach out to schedule an office visit.  Thank you! Saddie Sacks, PA-C

## 2024-05-18 NOTE — Assessment & Plan Note (Signed)
 Last lipid panel: LDL 139, HDL 57, triglycerides 66.  Improved slightly from the last time they were checked.  Encouraged patient to continue daily fish oil supplement.  Also discussed the benefits of red yeast rice supplement to support cholesterol.  Given lack of other risk factors (diabetes, hypertension, smoking), okay to continue managing with diet, lifestyle, natural supplements.  Will recheck lipid panel in 6 months and start medication if indicated.

## 2024-05-18 NOTE — Assessment & Plan Note (Signed)
 PHQ-9 score stable at 10.  Patient feels that mood is stable and would like to continue with her current medication regimen.  Continue Lexapro  5 mg daily, Wellbutrin  300 mg daily, hydroxyzine  as needed.  Will continue monitor.

## 2024-05-18 NOTE — Progress Notes (Deleted)
   Established Patient Office Visit  Subjective   Patient ID: Stephanie Chan, female    DOB: [birth year]  Age: 58 y.o. MRN: [id number]  No chief complaint on file.   HPI  Stephanie Chan is a 57 y.o. y/o female who presents to the clinic today for follow up on mood, HLD.   Mood: Currently taking Lexapro  5 mg daily, Wellbutrin  300 mg daily, hydroxyzine  as needed.   HLD: Not currently on lipid lowering medications. Managing with diet and exercise. Takes fish oil supplement daily ***  The 10-year ASCVD risk score (Arnett DK, et al., 2019) is: 2.2%  {History (Optional):23778}  ROS Per HPI.    Objective:     There were no vitals taken for this visit. {Vitals History (Optional):23777}  Physical Exam   No results found for any visits on 05/18/24.  {Labs (Optional):23779}  The 10-year ASCVD risk score (Arnett DK, et al., 2019) is: 2.2%    Assessment & Plan:   There are no diagnoses linked to this encounter.  No follow-ups on file.    Stephanie JULIANNA Sacks, PA-C

## 2024-05-18 NOTE — Progress Notes (Signed)
 Established Patient Office Visit  Subjective   Patient ID: Stephanie Chan, female    DOB: [birth year]  Age: 58 y.o. MRN: [id number]  Chief Complaint  Patient presents with   Follow Up Mood and Cholesterol    HPI  Stephanie Chan is a 58 y.o. y/o female who presents to the clinic today for follow up on mood, HLD.   Mood: Currently taking Lexapro  5 mg daily, Wellbutrin  300 mg daily, hydroxyzine  as needed.   HLD: Not currently on lipid lowering medications. Managing with diet and exercise. Takes fish oil supplement daily. The 10-year ASCVD risk score (Arnett DK, et al., 2019) is: 1.3%    ROS Per HPI.    Objective:     BP 96/60   Pulse 79   Ht 5' 7 (1.702 m)   Wt 203 lb 4 oz (92.2 kg)   SpO2 97%   BMI 31.83 kg/m    Physical Exam Constitutional:      General: She is not in acute distress.    Appearance: Normal appearance.   Cardiovascular:     Rate and Rhythm: Normal rate and regular rhythm.     Heart sounds: Normal heart sounds. No murmur heard.    No friction rub. No gallop.  Pulmonary:     Effort: Pulmonary effort is normal. No respiratory distress.     Breath sounds: Normal breath sounds.   Musculoskeletal:        General: No swelling.   Skin:    General: Skin is warm and dry.   Neurological:     General: No focal deficit present.     Mental Status: She is alert.   Psychiatric:        Mood and Affect: Mood normal.        Behavior: Behavior normal.        Thought Content: Thought content normal.      No results found for any visits on 05/18/24.  Last metabolic panel Lab Results  Component Value Date   GLUCOSE 87 05/14/2024   NA 141 05/14/2024   K 4.2 05/14/2024   CL 103 05/14/2024   CO2 22 05/14/2024   BUN 13 05/14/2024   CREATININE 0.82 05/14/2024   EGFR 84 05/14/2024   CALCIUM 8.9 05/14/2024   PROT 7.0 05/14/2024   ALBUMIN 4.4 05/14/2024   LABGLOB 2.6 05/14/2024   AGRATIO 1.9 09/03/2022   BILITOT 0.3 05/14/2024    ALKPHOS 109 05/14/2024   AST 19 05/14/2024   ALT 22 05/14/2024   ANIONGAP 8 10/06/2021   Last lipids Lab Results  Component Value Date   CHOL 208 (H) 05/14/2024   HDL 57 05/14/2024   LDLCALC 139 (H) 05/14/2024   TRIG 66 05/14/2024   CHOLHDL 3.6 05/14/2024      The 10-year ASCVD risk score (Arnett DK, et al., 2019) is: 1.3%    Assessment & Plan:   Pure hypercholesterolemia Assessment & Plan: Last lipid panel: LDL 139, HDL 57, triglycerides 66.  Improved slightly from the last time they were checked.  Encouraged patient to continue daily fish oil supplement.  Also discussed the benefits of red yeast rice supplement to support cholesterol.  Given lack of other risk factors (diabetes, hypertension, smoking), okay to continue managing with diet, lifestyle, natural supplements.  Will recheck lipid panel in 6 months and start medication if indicated.   Depression, unspecified depression type Assessment & Plan: PHQ-9 score stable at 10.  Patient feels that mood is stable and would  like to continue with her current medication regimen.  Continue Lexapro  5 mg daily, Wellbutrin  300 mg daily, hydroxyzine  as needed.  Will continue monitor.   Panic anxiety syndrome Assessment & Plan: GAD-7 score improved from 8 to 6.  Patient feels that mood/anxiety is stable.  Continue Lexapro  5 mg daily, Wellbutrin  300 mg daily, hydroxyzine  as needed.  Will continue to monitor.     Return in about 6 months (around 11/17/2024) for Physical, HLD, Mood.    Saddie JULIANNA Sacks, PA-C

## 2024-05-25 ENCOUNTER — Other Ambulatory Visit: Payer: Self-pay | Admitting: Family Medicine

## 2024-05-25 DIAGNOSIS — F32A Depression, unspecified: Secondary | ICD-10-CM

## 2024-08-06 ENCOUNTER — Encounter: Payer: Self-pay | Admitting: Family Medicine

## 2024-08-06 ENCOUNTER — Ambulatory Visit: Payer: Self-pay

## 2024-08-06 ENCOUNTER — Ambulatory Visit (INDEPENDENT_AMBULATORY_CARE_PROVIDER_SITE_OTHER): Admitting: Family Medicine

## 2024-08-06 VITALS — BP 133/79 | HR 76 | Ht 67.0 in | Wt 201.0 lb

## 2024-08-06 DIAGNOSIS — I447 Left bundle-branch block, unspecified: Secondary | ICD-10-CM | POA: Insufficient documentation

## 2024-08-06 DIAGNOSIS — R0789 Other chest pain: Secondary | ICD-10-CM | POA: Diagnosis not present

## 2024-08-06 MED ORDER — MELOXICAM 15 MG PO TABS: 15.0000 mg | ORAL_TABLET | Freq: Every day | ORAL | 0 refills | Status: DC

## 2024-08-06 NOTE — Telephone Encounter (Signed)
 FYI Only or Action Required?: FYI only for provider.  Patient was last seen in primary care on 05/18/2024 by Stephanie Saddie FALCON, PA-C.  Called Nurse Triage reporting Chest Pain.  Symptoms began about a month ago.  Interventions attempted: Rest, hydration, or home remedies.  Symptoms are: unchanged.  Triage Disposition: Go to ED Now (or PCP Triage)  Patient/caregiver understands and will follow disposition?: No  Copied from CRM [id number]. Topic: Clinical - Red Word Triage >> Aug 06, 2024  8:34 AM Stephanie Chan wrote: Red Word that prompted transfer to Nurse Triage: chest pain for about a month and think its because of stress. Does not want to visit the ER. Reason for Disposition  [1] Chest pain lasts > 5 minutes AND [2] occurred in past 3 days (72 hours) (Exception: Feels exactly the same as previously diagnosed heartburn and has accompanying sour taste in mouth.)  Answer Assessment - Initial Assessment Questions 1. LOCATION: Where does it hurt?       Chest center, 'maybe a little to the left even 2. RADIATION: Does the pain go anywhere else? (e.g., into neck, jaw, arms, back)     denies 3. ONSET: When did the chest pain begin? (Minutes, hours or days)      Been going on for about a month 4. PATTERN: Does the pain come and go, or has it been constant since it started?  Does it get worse with exertion?      Intermittent, but when it occurs it is there most of the time. 6. SEVERITY: How bad is the pain?  (e.g., Scale 1-10; mild, moderate, or severe)     4 7. CARDIAC RISK FACTORS: Do you have any history of heart problems or risk factors for heart disease? (e.g., angina, prior heart attack; diabetes, high blood pressure, high cholesterol, smoker, or strong family history of heart disease)     LBBB 8. PULMONARY RISK FACTORS: Do you have any history of lung disease?  (e.g., blood clots in lung, asthma, emphysema, birth control pills)     denies 9. CAUSE: What do you think is  causing the chest pain?     stress 10. OTHER SYMPTOMS: Do you have any other symptoms? (e.g., dizziness, nausea, vomiting, sweating, fever, difficulty breathing, cough)       Pt states that she has intermittent dizziness.   Pt states that she is under a lot of stress. Pt states that it feels like gas but after burping is still there. Pt states I will not go to the ED, I just want a visit today to have my vitals checked.  Protocols used: Chest Pain-A-AH

## 2024-08-06 NOTE — Progress Notes (Unsigned)
 Acute Office Visit  Subjective:     Patient ID: Stephanie Chan, female    DOB: [birth year], 58 y.o.   MRN: [id number]  Chief Complaint  Patient presents with   Chest Pain    HPI Patient is in today for   Subjective - Chest pain ongoing for approximately one month. Describes it as a tightness, which has been intermittent but present all day today. Reports the pain is localized and tender to palpation. Denies radiation. Pain is not clearly related to exertion and is present at rest. Reports a sensation of needing to burp without relief.  - Reports some shortness of breath today with stair climbing, but also notes deconditioning from lack of exercise since July.  - Notes occasional dizziness but is unsure of the cause.  - Reports sweating, which is attributed to postmenopausal status.  - Reports significant life stress, acting as the primary caregiver for her husband who has had multiple surgeries since February.  Medications: Bupropion , on for over 20 years. Lexapro , on for a long time, sometimes takes a half or full dose. Hydroxyzine  as needed for anxiety, last taken yesterday. Has taken hydroxyzine  more frequently in the past year due to stress.  PMH, PSH, FH, Social Hx: PMHx: Left bundle branch block (diagnosed ~15 years ago), history of panic attacks, postmenopausal. PSH: None mentioned. FH: Mother is 37, father lived to 3. No significant family history of heart disease or stroke. One sister also has a branch block. 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 Reports high stress level as she is the primary caregiver for her husband. Used to be a religious walker but has not exercised much since July.  ROS: Cardiovascular: (+) chest pain, (+) occasional dizziness. Denies pain radiating to the arm. Respiratory: (+) shortness of breath on exertion, (+) cough with some phlegm two days ago. GI: (+) sensation of needing to burp. Denies nausea. Psych: (+) history of panic  attacks, (+) high stress, (+) anxiety. Constitutional: (+) sweating.  Objective   The 10-year ASCVD risk score (Arnett DK, et al., 2019) is: 2.8%   ROS      Objective:    BP (!) 145/92   Pulse 76   Ht 5' 7 (1.702 m)   Wt 201 lb (91.2 kg)   SpO2 99%   BMI 31.48 kg/m  {Vitals History (Optional):23777}  Physical Exam General: No acute distress. Lungs: Clear to auscultation bilaterally. Cardiovascular: Regular rate and rhythm. MSK: Tenderness to palpation over the left sternocostal joints.   No results found for any visits on 08/06/24.      Assessment & Plan:   Musculoskeletal chest pain Assessment & Plan: The presentation of chest pain that is tender to palpation is most consistent with a musculoskeletal etiology, likely costochondritis. This can be caused by strain, inflammation, or overuse. The pain is not clearly associated with exertion. Lungs are clear. EKG shows a persistent left bundle branch block, unchanged from EKG in 2022. - Take meloxicam  once daily for two weeks. - Use Tylenol  as needed for pain. - Apply topical heat (heating pad) to the area. - Perform pectoral stretching exercises daily. - Advised to go to the emergency department if pain worsens, or if associated with nausea, pain radiating down the arm, shortness of breath, or sweating. - Follow up with Deitra in December.   LBBB (left bundle branch block) Assessment & Plan: History of LBBB for approximately 15 years. This is a persistent finding on today's EKG. Given the LBBB, further  evaluation of cardiac structure and function is warranted. - Ordered an echocardiogram to evaluate cardiac structure and function. Patient will be called to schedule. Counseled that this test looks at heart shape and pump function, not coronary artery blockages.  Orders: -     EKG 12-Lead -     ECHOCARDIOGRAM COMPLETE; Future  Other orders -     Meloxicam ; Take 1 tablet (15 mg total) by mouth daily.  Dispense: 30  tablet; Refill: 0     Return if symptoms worsen or fail to improve.  Toribio MARLA Slain, MD

## 2024-08-06 NOTE — Assessment & Plan Note (Signed)
 The presentation of chest pain that is tender to palpation is most consistent with a musculoskeletal etiology, likely costochondritis. This can be caused by strain, inflammation, or overuse. The pain is not clearly associated with exertion. Lungs are clear. EKG shows a persistent left bundle branch block, unchanged from EKG in 2022. - Take meloxicam  once daily for two weeks. - Use Tylenol  as needed for pain. - Apply topical heat (heating pad) to the area. - Perform pectoral stretching exercises daily. - Advised to go to the emergency department if pain worsens, or if associated with nausea, pain radiating down the arm, shortness of breath, or sweating. - Follow up with Stephanie Chan in December.

## 2024-08-06 NOTE — Assessment & Plan Note (Signed)
 History of LBBB for approximately 15 years. This is a persistent finding on today's EKG. Given the LBBB, further evaluation of cardiac structure and function is warranted. - Ordered an echocardiogram to evaluate cardiac structure and function. Patient will be called to schedule. Counseled that this test looks at heart shape and pump function, not coronary artery blockages.

## 2024-08-06 NOTE — Patient Instructions (Addendum)
 It was nice to see you today,  We addressed the following topics today: -Your chest pain appears to be musculoskeletal.  I would recommend using topical heat, Tylenol  and NSAIDs such as meloxicam  and pectoral muscle stretching exercises. - We has only a left bundle branch block I would like to get a echocardiogram.  I will order this and someone will call you to schedule it.   Have a great day,  Rolan Slain, MD

## 2024-08-19 ENCOUNTER — Other Ambulatory Visit: Payer: Self-pay | Admitting: Family Medicine

## 2024-08-19 DIAGNOSIS — F32A Depression, unspecified: Secondary | ICD-10-CM

## 2024-09-03 ENCOUNTER — Ambulatory Visit (HOSPITAL_BASED_OUTPATIENT_CLINIC_OR_DEPARTMENT_OTHER)

## 2024-09-03 DIAGNOSIS — I447 Left bundle-branch block, unspecified: Secondary | ICD-10-CM | POA: Diagnosis not present

## 2024-09-03 LAB — ECHOCARDIOGRAM COMPLETE
Area-P 1/2: 4.06 cm2
Calc EF: 67.2 %
S' Lateral: 3.23 cm
Single Plane A2C EF: 78 %
Single Plane A4C EF: 50.5 %

## 2024-09-07 ENCOUNTER — Ambulatory Visit: Payer: Self-pay | Admitting: Family Medicine

## 2024-10-07 ENCOUNTER — Other Ambulatory Visit (HOSPITAL_COMMUNITY)
Admission: RE | Admit: 2024-10-07 | Discharge: 2024-10-07 | Disposition: A | Discharge status: Home / Self Care | Source: Ambulatory Visit | Attending: Obstetrics and Gynecology | Admitting: Obstetrics and Gynecology

## 2024-10-07 ENCOUNTER — Ambulatory Visit: Admitting: Obstetrics and Gynecology

## 2024-10-07 ENCOUNTER — Other Ambulatory Visit: Payer: Self-pay

## 2024-10-07 ENCOUNTER — Encounter: Payer: Self-pay | Admitting: Obstetrics and Gynecology

## 2024-10-07 VITALS — BP 118/76 | HR 66 | Wt 201.0 lb

## 2024-10-07 DIAGNOSIS — Z01419 Encounter for gynecological examination (general) (routine) without abnormal findings: Secondary | ICD-10-CM

## 2024-10-07 DIAGNOSIS — Z124 Encounter for screening for malignant neoplasm of cervix: Secondary | ICD-10-CM | POA: Insufficient documentation

## 2024-10-07 DIAGNOSIS — N958 Other specified menopausal and perimenopausal disorders: Secondary | ICD-10-CM

## 2024-10-07 DIAGNOSIS — Z0000 Encounter for general adult medical examination without abnormal findings: Secondary | ICD-10-CM

## 2024-10-07 DIAGNOSIS — Z780 Asymptomatic menopausal state: Secondary | ICD-10-CM

## 2024-10-07 MED ORDER — ESTRADIOL 0.01 % VA CREA: TOPICAL_CREAM | VAGINAL | 12 refills | Status: AC

## 2024-10-07 NOTE — Patient Instructions (Addendum)
 We will see how you are doing with just the anti-depressant. If the hot flashes are not well controlled, we can re-visit hormones since your main risk factor for adverse/bad effects is due to time since last period but otherwise, a reasonable candidate for hormone therapy (you are in good heart health with stand

## 2024-10-07 NOTE — Progress Notes (Signed)
 NEW GYNECOLOGY PATIENT Patient name: Stephanie Chan MRN [id number]  Date of birth: May 09, 1966 Chief Complaint:   Hormone Replacement Therapy  History:  Discussed the use of AI scribe software for clinical note transcription with the patient, who gave verbal consent to proceed.  History of Present Illness Stephanie Chan is a 58 year old female who presents with menopausal symptoms including hot flashes and vaginal dryness.  She has not had a menstrual period for approximately 15 years, with cessation of menstruation occurring in her early forties. During this time, she experienced hot flashes, which have persisted intermittently. She has not pursued formal treatments such as hormone replacement therapy but has taken over-the-counter supplements and vitamins. Hormone levels were checked at [facility]. Had been afraid of hormone use.   She describes experiencing vaginal dryness, noting variability in the symptom. During intercourse, she sometimes does not experience dryness, indicating inconsistency. Has tried lubricant, which has helped. She has not used any creams or treatments for this issue. Has also noticed increased irritability, decreased libido, and difficulty with concentration.  Her current medications include Wellbutrin  and Lexapro , which she adjusts based on mood and symptoms. She takes half a Lexapro  but sometimes increases to a full dose if her mood worsens. Hydroxyzine  is used occasionally for anxiety, discovered effective after being prescribed for a rash.  She mentions a positive HPV test in the past, with a colposcopy in 2022 showing low-grade changes. Declines STI testing today. Last mammogram in December 2024  Pap with HR hpv 2021 and 2022; colpo with CIN 1, recommended repeat      Gynecologic History No LMP recorded. Patient is perimenopausal. Contraception: post menopausal status   OB History  Gravida Para Term Preterm AB Living  3 3 3   3    SAB IAB Ectopic Multiple Live Births      3    # Outcome Date GA Lbr Len/2nd Weight Sex Type Anes PTL Lv  3 Term 2001     VBAC   LIV  2 Term 1998     CS-LTranv   LIV  1 Term 63     Vag-Spont   LIV     The following portions of the patient's history were reviewed and updated as appropriate: allergies, current medications, past family history, past medical history, past social history, past surgical history and problem list. Health Maintenance  Topic Date Due   Hepatitis B Vaccine (1 of 3 - 19+ 3-dose series) Never done   Pneumococcal Vaccine for age over 5 (1 of 1 - PCV) Never done   Zoster (Shingles) Vaccine (1 of 2) Never done   Flu Shot  06/19/2024   COVID-19 Vaccine (1 - 2025-26 season) Never done   Breast Cancer Screening  10/28/2025   Pap with HPV screening  01/11/2026   Colon Cancer Screening  12/19/2027   DTaP/Tdap/Td vaccine (3 - Td or Tdap) 02/25/2033   Hepatitis C Screening  Completed   HIV Screening  Completed   HPV Vaccine  Aged Out   Meningitis B Vaccine  Aged Out     Review of Systems Pertinent items noted in HPI and remainder of comprehensive ROS otherwise negative.  Physical Exam:  BP 118/76   Pulse 66   Wt 201 lb (91.2 kg)   BMI 31.48 kg/m  Physical Exam Vitals and nursing note reviewed. Exam conducted with a chaperone present.  Constitutional:      Appearance: Normal appearance.  Pulmonary:  Effort: Pulmonary effort is normal.  Abdominal:     Palpations: Abdomen is soft.  Genitourinary:    General: Normal vulva.     Exam position: Lithotomy position.     Comments: Mild atrophic changes to vaginal introitus No pelvic floor tenderness Atrophic appearing cervix Neurological:     Mental Status: She is alert.      Assessment and Plan:   Assessment & Plan Menopausal symptoms (hot flashes, vaginal dryness, mood changes) Menopausal symptoms persisting for over 15 years. HRT poses increased risk. Vaginal estrogen safe for dryness.  Non-hormonal options for hot flashes include Biosom and antidepressants. Current medications include Wellbutrin  and Lexapro . - Recommended vaginal estrogen for vaginal dryness. - Monitor hot flashes and mood changes with current Lexapro  regimen. - Consider non-hormonal medication for hot flashes if needed. - Encouraged lifestyle modifications for hot flashes. - Track mood and hot flashes to assess medication effects. 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 Regular screening essential. - Continue regular PAP screening to monitor HPV status.  Screening for malignant neoplasm of cervix Due for routine cervical cancer screening. Previous colposcopy in 2022 showed low-grade changes. - Performed PAP smear for cervical cancer screening.  Depression Managed with Lexapro  and Wellbutrin . Lexapro  may affect hot flashes and mood. Wellbutrin  helps with antidepressant-induced sexual dysfunction. - Continue Lexapro   - Monitor mood and hot flashes to assess medication effects.   Follow-up: No follow-ups on file.      Carter Quarry, MD Obstetrician & Gynecologist, Faculty Practice [facility] [employer], [facility]

## 2024-10-12 LAB — CYTOLOGY - PAP
Comment: NEGATIVE
Comment: NEGATIVE
Comment: NEGATIVE
Diagnosis: UNDETERMINED — AB
HPV 16: NEGATIVE
HPV 18 / 45: NEGATIVE
High risk HPV: POSITIVE — AB

## 2024-10-13 ENCOUNTER — Ambulatory Visit: Payer: Self-pay | Admitting: Obstetrics and Gynecology

## 2024-10-13 NOTE — Telephone Encounter (Addendum)
 Called patient regarding pap smear results and physician's advise for colposcopy. Patient verbalized understanding and denies any other questions or concerns.   Rosaline, RN  ----- Message from Carter Quarry sent at 10/13/2024  7:31 AM EST ----- Please scheduled patient for colposcopy  ----- Message ----- From: Interface, Lab In Three [facility] Seven Sent: 10/12/2024   9:04 PM EST To: Carter Quarry, MD

## 2024-10-26 ENCOUNTER — Other Ambulatory Visit: Payer: Self-pay

## 2024-10-26 DIAGNOSIS — F32A Depression, unspecified: Secondary | ICD-10-CM

## 2024-10-29 ENCOUNTER — Telehealth: Payer: Self-pay

## 2024-10-29 NOTE — Telephone Encounter (Signed)
 PT called in Insurance - issues medication we changed the way its prescribed due to the dosage the pts takes.    Talk a half and sometimes take as whole. Used to take a whole, and was having issues and said take a half.    Pt then was having issues taking half so would take a whole as needed per discussions with primary.   Now having issues with insurance not being able to prescribe the medication.   Pt would like to know if prescription can be sent in. Please follow up with pt for any questions.

## 2024-10-29 NOTE — Telephone Encounter (Signed)
 Copied from CRM #[id number]. Topic: Clinical - Prescription Issue >> Oct 29, 2024  2:16 PM Dedra B wrote: Reason for CRM: Pt's escitalopram  (LEXAPRO ) 10 MG tablet was sent to the pharmacy 12/8/225. She was told by pharmacy that her surplus limit has been exceeded and she can't pick up her prescription until 11/02/24. Pt would like a call to explain what this means and if it's accurate.

## 2024-11-01 ENCOUNTER — Other Ambulatory Visit: Payer: Self-pay | Admitting: Family Medicine

## 2024-11-01 DIAGNOSIS — Z6830 Body mass index (BMI) 30.0-30.9, adult: Secondary | ICD-10-CM

## 2024-11-01 DIAGNOSIS — F32A Depression, unspecified: Secondary | ICD-10-CM

## 2024-11-01 DIAGNOSIS — E559 Vitamin D deficiency, unspecified: Secondary | ICD-10-CM

## 2024-11-01 DIAGNOSIS — E7800 Pure hypercholesterolemia, unspecified: Secondary | ICD-10-CM

## 2024-11-05 ENCOUNTER — Inpatient Hospital Stay: Admission: RE | Admit: 2024-11-05 | Discharge: 2024-11-05 | Attending: Obstetrics and Gynecology

## 2024-11-05 DIAGNOSIS — Z01419 Encounter for gynecological examination (general) (routine) without abnormal findings: Secondary | ICD-10-CM

## 2024-11-10 ENCOUNTER — Other Ambulatory Visit

## 2024-11-10 DIAGNOSIS — E7800 Pure hypercholesterolemia, unspecified: Secondary | ICD-10-CM

## 2024-11-10 DIAGNOSIS — E559 Vitamin D deficiency, unspecified: Secondary | ICD-10-CM

## 2024-11-10 DIAGNOSIS — Z6830 Body mass index (BMI) 30.0-30.9, adult: Secondary | ICD-10-CM

## 2024-11-17 ENCOUNTER — Ambulatory Visit (INDEPENDENT_AMBULATORY_CARE_PROVIDER_SITE_OTHER)

## 2024-11-17 VITALS — BP 94/58 | HR 85 | Temp 98.4°F | Ht 67.0 in | Wt 201.1 lb

## 2024-11-17 DIAGNOSIS — M25551 Pain in right hip: Secondary | ICD-10-CM | POA: Insufficient documentation

## 2024-11-17 DIAGNOSIS — Z6830 Body mass index (BMI) 30.0-30.9, adult: Secondary | ICD-10-CM | POA: Diagnosis not present

## 2024-11-17 DIAGNOSIS — F410 Panic disorder [episodic paroxysmal anxiety] without agoraphobia: Secondary | ICD-10-CM

## 2024-11-17 DIAGNOSIS — Z0000 Encounter for general adult medical examination without abnormal findings: Secondary | ICD-10-CM | POA: Insufficient documentation

## 2024-11-17 DIAGNOSIS — F32A Depression, unspecified: Secondary | ICD-10-CM

## 2024-11-17 DIAGNOSIS — M25552 Pain in left hip: Secondary | ICD-10-CM

## 2024-11-17 MED ORDER — HYDROXYZINE HCL 10 MG PO TABS: 10.0000 mg | ORAL_TABLET | Freq: Three times a day (TID) | ORAL | 0 refills | Status: AC | PRN

## 2024-11-17 MED ORDER — ESCITALOPRAM OXALATE 10 MG PO TABS: 5.0000 mg | ORAL_TABLET | Freq: Every day | ORAL | 4 refills | Status: AC

## 2024-11-17 NOTE — Progress Notes (Signed)
 "  Complete physical exam  Patient: Stephanie Chan   DOB: [birth year]   58 y.o. Female  MRN: [id number]  Subjective:    Chief Complaint  Patient presents with   Annual Exam     History of Present Illness   Stephanie Chan is a 58 year old female who presents for an annual physical exam and medication review.  Anxiety and mood symptoms - Experiences anxiety, managed with Lexapro  (variable dosing) and Wellbutrin  300 mg daily. - Uses hydroxyzine  10 mg as needed for anxiety, finds it helpful and prefers it over Xanax . - Reports sexual side effects with Lexapro , leading to occasional increased dosing.  Musculoskeletal pain - Experiences hip pain, bilateral, particularly at night when lying in bed, attributed to weight gain and sedentary lifestyle. Stiff in nature. No sharp pains or tingling/radiation. No pulling pain.  - Weight gain over the past year due to decreased activity and husband's health issues.  Lifestyle and weight management - Planning to increase physical activity with exercise classes and E-bikes with her husband. - Interested in weight loss and considering a calorie deficit diet, aiming for about 1500 calories a day with a focus on protein intake. - Reports regular bowel movements - Does not smoke or drink alcohol. - Sleeping well   Preventive health and laboratory evaluation - Fasting for lab work including A1c, thyroid, cholesterol panel, kidney, and liver function tests.          Most recent fall risk assessment:    11/17/2024    9:13 AM  Fall Risk   Falls in the past year? 0  Injury with Fall? 0  Risk for fall due to : No Fall Risks  Follow up Falls evaluation completed     Most recent depression screenings:    11/17/2024    9:13 AM 08/06/2024    3:03 PM  PHQ 2/9 Scores  PHQ - 2 Score 2 0  PHQ- 9 Score 6 6      Data saved with a previous flowsheet row definition    Vision:Within last year and Dental: No current dental problems and  Receives regular dental care    Patient Care Team: Gayle Saddie JULIANNA DEVONNA as PCP - General (Physician Assistant)   Show/hide medication list[1]  ROS   Per HPI     Objective:     BP (!) 94/58   Pulse 85   Temp 98.4 F (36.9 C) (Oral)   Ht 5' 7 (1.702 m)   Wt 201 lb 1.9 oz (91.2 kg)   SpO2 97%   BMI 31.50 kg/m    Physical Exam Constitutional:      General: She is not in acute distress.    Appearance: Normal appearance.  Cardiovascular:     Rate and Rhythm: Normal rate and regular rhythm.     Heart sounds: Normal heart sounds. No murmur heard.    No friction rub. No gallop.  Pulmonary:     Effort: Pulmonary effort is normal. No respiratory distress.     Breath sounds: Normal breath sounds.  Abdominal:     General: Bowel sounds are normal.  Musculoskeletal:        General: No swelling.     Cervical back: Neck supple.     Right hip: No tenderness. Normal range of motion.     Left hip: No tenderness. Normal range of motion.  Lymphadenopathy:     Cervical: No cervical adenopathy.  Skin:    General: Skin is warm and  dry.  Neurological:     General: No focal deficit present.     Mental Status: She is alert.  Psychiatric:        Mood and Affect: Mood normal.        Behavior: Behavior normal.        Thought Content: Thought content normal.       No results found for any visits on 11/17/24. Last CBC Lab Results  Component Value Date   WBC 3.9 10/02/2023   HGB 13.6 10/02/2023   HCT 40.0 10/02/2023   MCV 91 10/02/2023   MCH 30.8 10/02/2023   RDW 12.6 10/02/2023   PLT 203 10/02/2023   Last metabolic panel Lab Results  Component Value Date   GLUCOSE 87 05/14/2024   NA 141 05/14/2024   K 4.2 05/14/2024   CL 103 05/14/2024   CO2 22 05/14/2024   BUN 13 05/14/2024   CREATININE 0.82 05/14/2024   EGFR 84 05/14/2024   CALCIUM 8.9 05/14/2024   PROT 7.0 05/14/2024   ALBUMIN 4.4 05/14/2024   LABGLOB 2.6 05/14/2024   AGRATIO 1.9 09/03/2022   BILITOT 0.3  05/14/2024   ALKPHOS 109 05/14/2024   AST 19 05/14/2024   ALT 22 05/14/2024   ANIONGAP 8 10/06/2021   Last lipids Lab Results  Component Value Date   CHOL 208 (H) 05/14/2024   HDL 57 05/14/2024   LDLCALC 139 (H) 05/14/2024   TRIG 66 05/14/2024   CHOLHDL 3.6 05/14/2024   Last hemoglobin A1c Lab Results  Component Value Date   HGBA1C 5.2 10/02/2023   Last thyroid functions Lab Results  Component Value Date   TSH 1.040 10/02/2023   Last vitamin D  Lab Results  Component Value Date   VD25OH 61.5 10/02/2023        Assessment & Plan:    Routine Health Maintenance and Physical Exam  Health Maintenance  Topic Date Due   Hepatitis B Vaccine (1 of 3 - 19+ 3-dose series) Never done   COVID-19 Vaccine (1 - 2025-26 season) 12/03/2024*   Zoster (Shingles) Vaccine (1 of 2) 02/15/2025*   Flu Shot  02/16/2025*   Pneumococcal Vaccine for age over 14 (1 of 1 - PCV) 11/17/2025*   Breast Cancer Screening  11/05/2026   Colon Cancer Screening  12/19/2027   Pap with HPV screening  10/07/2029   DTaP/Tdap/Td vaccine (3 - Td or Tdap) 02/25/2033   Hepatitis C Screening  Completed   HIV Screening  Completed   HPV Vaccine  Aged Out   Meningitis B Vaccine  Aged Out  *Topic was postponed. The date shown is not the original due date.    Discussed health benefits of physical activity, and encouraged her to engage in regular exercise appropriate for her age and condition.  Panic anxiety syndrome Assessment & Plan: Patient feels that mood is stable and would like to continue with her current medication regimen. Continue Lexapro  5 -10 mg daily, Wellbutrin  300 mg daily, hydroxyzine  as needed. Will continue monitor.    Depression, unspecified depression type Assessment & Plan: Patient feels that mood is stable and would like to continue with her current medication regimen. Continue Lexapro  5-10 mg daily, Wellbutrin  300 mg daily, hydroxyzine  as needed. Will continue monitor.   Orders: -      Escitalopram  Oxalate; Take 0.5-1 tablets (5-10 mg total) by mouth daily.  Dispense: 45 tablet; Refill: 4  BMI 30.0-30.9,adult Assessment & Plan: Obesity contributes to bilateral hip and knee pain, exacerbated by sedentary lifestyle and  weight gain. Pain primarily nocturnal and associated with pressure when lying on the side. Weight loss anticipated to alleviate symptoms. - Provided home exercises for hip and knee pain management. - Encouraged weight loss through exercise and calorie monitoring. - Calculated and provided a calorie deficit plan with a goal of 1500-1700 calories per day and 90-100+ grams of protein daily.   Hip pain, bilateral Assessment & Plan: Suspect OA vs bursitis. Bursitis unlikely based on exam today. Pt would like to defer imaging for now.  - Provided home exercises for hip and knee pain management. - Encouraged weight loss through exercise and calorie monitoring. - Encouraged to follow up if pain worsens or does not improve and we will escalate work up    Wellness examination Assessment & Plan: Health maintenance up to date with recent mammogram and Pap smear. Up to date on colonoscopy screening. Opted to hold off on flu shot and not offered COVID vaccine. - Continue current health maintenance schedule.    Other orders -     hydrOXYzine  HCl; Take 1 tablet (10 mg total) by mouth 3 (three) times daily as needed.  Dispense: 30 tablet; Refill: 0    Return in about 6 months (around 05/18/2025) for Mood, HLD.     Saddie JULIANNA Sacks, PA-C     [1]  Outpatient Medications Prior to Visit  Medication Sig   acetaminophen  (TYLENOL ) 500 MG tablet Take 1,000 mg by mouth every 6 (six) hours as needed for moderate pain or headache. Pain   aspirin  EC 81 MG tablet Take 81 mg by mouth daily.   B Complex-Biotin-FA (B-COMPLEX PO) Take 1 capsule by mouth daily.   buPROPion  (WELLBUTRIN  XL) 300 MG 24 hr tablet TAKE 1 TABLET (300 MG TOTAL) BY MOUTH DAILY.   cetirizine-pseudoephedrine  (ZYRTEC-D) 5-120 MG per tablet Take 1 tablet by mouth daily as needed for allergies.   Cholecalciferol (VITAMIN D3) 5000 units CAPS Take 1 capsule (5,000 Units total) by mouth daily.   estradiol  (ESTRACE ) 0.01 % CREA vaginal cream Apply 1 gram per vagina every night for 2 weeks, then apply three times a week   ibuprofen  (ADVIL ,MOTRIN ) 200 MG tablet Take 400 mg by mouth every 6 (six) hours as needed. Pain   Magnesium 200 MG TABS Take 1 tablet by mouth daily.   Misc Natural Products (GLUCOSAMINE CHOND COMPLEX/MSM) TABS Take 1 tablet by mouth daily.   Multiple Vitamin (MULTIVITAMIN) tablet Take 1 tablet by mouth daily.   Omega-3 Fatty Acids (FISH OIL) 1200 MG CAPS Take 1 capsule by mouth daily.   scopolamine  (TRANSDERM-SCOP) 1 MG/3DAYS Place 1 patch (1.5 mg total) onto the skin every 3 (three) days.   [DISCONTINUED] escitalopram  (LEXAPRO ) 10 MG tablet TAKE 1/2 TABLET (5 MG TOTAL) BY MOUTH DAILY.   [DISCONTINUED] hydrOXYzine  (ATARAX ) 10 MG tablet TAKE 1 TO 2 TABLETS BY MOUTH TWICE A DAY AS NEEDED FOR ITCHING.   [DISCONTINUED] ibuprofen  (ADVIL ) 800 MG tablet Take 1 tablet (800 mg total) by mouth every 8 (eight) hours as needed.   [DISCONTINUED] meloxicam  (MOBIC ) 15 MG tablet Take 1 tablet (15 mg total) by mouth daily.   [DISCONTINUED] fluticasone (FLONASE) 50 MCG/ACT nasal spray Place into the nose. (Patient not taking: Reported on 11/17/2024)   No facility-administered medications prior to visit.   "

## 2024-11-17 NOTE — Assessment & Plan Note (Signed)
 Patient feels that mood is stable and would like to continue with her current medication regimen. Continue Lexapro  5-10 mg daily, Wellbutrin  300 mg daily, hydroxyzine  as needed. Will continue monitor.

## 2024-11-17 NOTE — Patient Instructions (Signed)
 VISIT SUMMARY: Today, you had your annual physical exam and medication review. We discussed your current medications, anxiety and mood symptoms, musculoskeletal pain, lifestyle and weight management, and preventive health measures.  YOUR PLAN: MAJOR DEPRESSIVE DISORDER: Your depression is managed with Lexapro  and Wellbutrin . You also use hydroxyzine  as needed for anxiety. -Continue taking Wellbutrin  300 mg daily. -Refilled Lexapro  with instructions to take 5-10 mg based on your symptoms and side effects. -Refilled hydroxyzine  10 mg as needed for anxiety, but use it cautiously due to potential side effects.  OBESITY WITH ASSOCIATED BILATERAL HIP AND KNEE PAIN: Your weight is contributing to hip and knee pain, especially at night. -Follow the home exercises provided for managing hip and knee pain. -Aim for weight loss through increased physical activity and monitoring your calorie intake. -Follow the calorie deficit plan with a goal of 1500 calories per day and 90-100 grams of protein daily.  GENERAL HEALTH MAINTENANCE: Your preventive health measures are up to date, including recent mammogram, Pap smear, and colonoscopy. -Continue with your current health maintenance schedule. -You have opted to hold off on the flu shot for now.  If you have any problems before your next visit feel free to message me via MyChart (minor issues or questions) or call the office, otherwise you may reach out to schedule an office visit.  Thank you! Saddie Sacks, PA-C

## 2024-11-17 NOTE — Assessment & Plan Note (Signed)
 Health maintenance up to date with recent mammogram and Pap smear. Up to date on colonoscopy screening. Opted to hold off on flu shot and not offered COVID vaccine. - Continue current health maintenance schedule.

## 2024-11-17 NOTE — Assessment & Plan Note (Signed)
 Obesity contributes to bilateral hip and knee pain, exacerbated by sedentary lifestyle and weight gain. Pain primarily nocturnal and associated with pressure when lying on the side. Weight loss anticipated to alleviate symptoms. - Provided home exercises for hip and knee pain management. - Encouraged weight loss through exercise and calorie monitoring. - Calculated and provided a calorie deficit plan with a goal of 1500-1700 calories per day and 90-100+ grams of protein daily.

## 2024-11-17 NOTE — Assessment & Plan Note (Signed)
 Suspect OA vs bursitis. Bursitis unlikely based on exam today. Pt would like to defer imaging for now.  - Provided home exercises for hip and knee pain management. - Encouraged weight loss through exercise and calorie monitoring. - Encouraged to follow up if pain worsens or does not improve and we will escalate work up

## 2024-11-18 LAB — COMPREHENSIVE METABOLIC PANEL WITH GFR
ALT: 38 IU/L — ABNORMAL HIGH (ref 0–32)
AST: 30 IU/L (ref 0–40)
Albumin: 4.4 g/dL (ref 3.8–4.9)
Alkaline Phosphatase: 114 IU/L (ref 49–135)
BUN/Creatinine Ratio: 26 — ABNORMAL HIGH (ref 9–23)
BUN: 18 mg/dL (ref 6–24)
Bilirubin Total: 0.4 mg/dL (ref 0.0–1.2)
CO2: 25 mmol/L (ref 20–29)
Calcium: 9.4 mg/dL (ref 8.7–10.2)
Chloride: 104 mmol/L (ref 96–106)
Creatinine, Ser: 0.7 mg/dL (ref 0.57–1.00)
Globulin, Total: 2.9 g/dL (ref 1.5–4.5)
Glucose: 96 mg/dL (ref 70–99)
Potassium: 4.5 mmol/L (ref 3.5–5.2)
Sodium: 142 mmol/L (ref 134–144)
Total Protein: 7.3 g/dL (ref 6.0–8.5)
eGFR: 101 mL/min/1.73

## 2024-11-18 LAB — CBC WITH DIFFERENTIAL/PLATELET
Basophils Absolute: 0 x10E3/uL (ref 0.0–0.2)
Basos: 1 %
EOS (ABSOLUTE): 0.1 x10E3/uL (ref 0.0–0.4)
Eos: 3 %
Hematocrit: 41.4 % (ref 34.0–46.6)
Hemoglobin: 13.7 g/dL (ref 11.1–15.9)
Immature Grans (Abs): 0 x10E3/uL (ref 0.0–0.1)
Immature Granulocytes: 0 %
Lymphocytes Absolute: 1.2 x10E3/uL (ref 0.7–3.1)
Lymphs: 27 %
MCH: 29.9 pg (ref 26.6–33.0)
MCHC: 33.1 g/dL (ref 31.5–35.7)
MCV: 90 fL (ref 79–97)
Monocytes Absolute: 0.3 x10E3/uL (ref 0.1–0.9)
Monocytes: 7 %
Neutrophils Absolute: 2.7 x10E3/uL (ref 1.4–7.0)
Neutrophils: 62 %
Platelets: 216 x10E3/uL (ref 150–450)
RBC: 4.58 x10E6/uL (ref 3.77–5.28)
RDW: 12.8 % (ref 11.7–15.4)
WBC: 4.4 x10E3/uL (ref 3.4–10.8)

## 2024-11-18 LAB — HEMOGLOBIN A1C
Est. average glucose Bld gHb Est-mCnc: 105 mg/dL
Hgb A1c MFr Bld: 5.3 % (ref 4.8–5.6)

## 2024-11-18 LAB — LIPID PANEL
Chol/HDL Ratio: 3.6 ratio (ref 0.0–4.4)
Cholesterol, Total: 221 mg/dL — ABNORMAL HIGH (ref 100–199)
HDL: 62 mg/dL
LDL Chol Calc (NIH): 137 mg/dL — ABNORMAL HIGH (ref 0–99)
Triglycerides: 125 mg/dL (ref 0–149)
VLDL Cholesterol Cal: 22 mg/dL (ref 5–40)

## 2024-11-18 LAB — APOLIPOPROTEIN B: Apolipoprotein B: 101 mg/dL — ABNORMAL HIGH

## 2024-11-18 LAB — TSH: TSH: 0.972 u[IU]/mL (ref 0.450–4.500)

## 2024-11-18 LAB — LIPOPROTEIN A (LPA): Lipoprotein (a): <8.4 nmol/L

## 2024-11-20 ENCOUNTER — Ambulatory Visit: Payer: Self-pay

## 2024-11-20 ENCOUNTER — Ambulatory Visit: Admitting: Obstetrics and Gynecology

## 2024-11-20 NOTE — Progress Notes (Deleted)
" ° ° °  GYNECOLOGY VISIT  Patient name: Stephanie Chan MRN [id number]  Date of birth: [birth year] Chief Complaint:   No chief complaint on file.  Las tseen 10/07/24 to discuss HRT - vaginal estrogen & continue lexapro ; if minimal imprvoemet, try low dose HRT (E2/Pro) so even though > 10 years, otherwise healthy History:  Discussed the use of AI scribe software for clinical note transcription with the patient, who gave verbal consent to proceed.  History of Present Illness      The following portions of the patient's history were reviewed and updated as appropriate: allergies, current medications, past family history, past medical history, past social history, past surgical history and problem list.   Health Maintenance:   Last pap     Component Value Date/Time   DIAGPAP (A) 10/07/2024 1121    - Atypical squamous cells of undetermined significance (ASC-US )   DIAGPAP  09/19/2020 1546    - Negative for intraepithelial lesion or malignancy (NILM)   HPVHIGH Positive (A) 10/07/2024 1121   HPVHIGH Positive (A) 09/19/2020 1546   ADEQPAP  10/07/2024 1121    Satisfactory for evaluation; transformation zone component PRESENT.   ADEQPAP  09/19/2020 1546    Satisfactory for evaluation; transformation zone component PRESENT.    Health Maintenance  Topic Date Due   Hepatitis B Vaccine (1 of 3 - 19+ 3-dose series) Never done   COVID-19 Vaccine (1 - 2025-26 season) 12/03/2024*   Zoster (Shingles) Vaccine (1 of 2) 02/15/2025*   Flu Shot  02/16/2025*   Pneumococcal Vaccine for age over 40 (1 of 1 - PCV) 11/17/2025*   Breast Cancer Screening  11/05/2026   Colon Cancer Screening  12/19/2027   Pap with HPV screening  10/07/2029   DTaP/Tdap/Td vaccine (3 - Td or Tdap) 02/25/2033   Hepatitis C Screening  Completed   HIV Screening  Completed   HPV Vaccine  Aged Out   Meningitis B Vaccine  Aged Out  *Topic was postponed. The date shown is not the original due date.      Review  of Systems:  {Ros - complete:30496} Comprehensive review of systems was otherwise negative.   Objective:  Physical Exam There were no vitals taken for this visit.   Physical Exam   Labs and Imaging MM 3D SCREENING MAMMOGRAM BILATERAL BREAST Result Date: 11/10/2024 CLINICAL DATA:  Screening. EXAM: DIGITAL SCREENING BILATERAL MAMMOGRAM WITH TOMOSYNTHESIS AND CAD TECHNIQUE: Bilateral screening digital craniocaudal and mediolateral oblique mammograms were obtained. Bilateral screening digital breast tomosynthesis was performed. The images were evaluated with computer-aided detection. COMPARISON:  Previous exam(s). ACR Breast Density Category b: There are scattered areas of fibroglandular density. FINDINGS: There are no findings suspicious for malignancy. IMPRESSION: No mammographic evidence of malignancy. A result letter of this screening mammogram will be mailed directly to the patient. RECOMMENDATION: Screening mammogram in one year. (Code:SM-B-01Y) BI-RADS CATEGORY  1: Negative. Electronically Signed   By: Dina  Arceo M.D.   On: 11/10/2024 07:31       Assessment & Plan:  Assessment and Plan Assessment & Plan        *** Routine preventative health maintenance measures emphasized.  Carter Quarry, MD [facility] "

## 2024-12-15 ENCOUNTER — Ambulatory Visit: Admitting: Obstetrics and Gynecology

## 2025-01-07 ENCOUNTER — Ambulatory Visit: Admitting: Obstetrics and Gynecology

## 2025-05-20 ENCOUNTER — Other Ambulatory Visit

## 2025-05-27 ENCOUNTER — Ambulatory Visit
